# Patient Record
Sex: Female | Born: 1985 | Race: White | Hispanic: No | Marital: Married | Smoking: Never smoker
Health system: Southern US, Community
[De-identification: ages and names within clinical notes are randomized; demographics above are authoritative.]

## PROBLEM LIST (undated history)

## (undated) ENCOUNTER — Inpatient Hospital Stay (HOSPITAL_COMMUNITY): Payer: Self-pay

## (undated) DIAGNOSIS — Z789 Other specified health status: Secondary | ICD-10-CM

## (undated) DIAGNOSIS — Z9189 Other specified personal risk factors, not elsewhere classified: Secondary | ICD-10-CM

## (undated) DIAGNOSIS — Z8619 Personal history of other infectious and parasitic diseases: Secondary | ICD-10-CM

## (undated) HISTORY — PX: WISDOM TOOTH EXTRACTION: SHX21

## (undated) HISTORY — DX: Other specified personal risk factors, not elsewhere classified: Z91.89

## (undated) HISTORY — PX: DILATION AND CURETTAGE OF UTERUS: SHX78

## (undated) HISTORY — DX: Personal history of other infectious and parasitic diseases: Z86.19

---

## 2010-05-05 ENCOUNTER — Other Ambulatory Visit: Payer: Self-pay | Admitting: Obstetrics & Gynecology

## 2010-05-05 ENCOUNTER — Ambulatory Visit (HOSPITAL_COMMUNITY)
Admission: RE | Admit: 2010-05-05 | Discharge: 2010-05-05 | Disposition: A | Discharge status: Home / Self Care | Payer: 59 | Source: Ambulatory Visit | Attending: Obstetrics & Gynecology | Admitting: Obstetrics & Gynecology

## 2010-05-05 DIAGNOSIS — O021 Missed abortion: Secondary | ICD-10-CM | POA: Insufficient documentation

## 2010-05-05 LAB — CBC
Platelets: 308 10*3/uL (ref 150–400)
RBC: 4.59 MIL/uL (ref 3.87–5.11)
RDW: 12.6 % (ref 11.5–15.5)
WBC: 11.2 10*3/uL — ABNORMAL HIGH (ref 4.0–10.5)

## 2010-06-02 NOTE — Op Note (Signed)
NAMERHYS, ANCHONDO              ACCOUNT NO.:  [id number]  MEDICAL RECORD NO.:  [id number]           PATIENT TYPE:  O  LOCATION:  [facility]                          [facility]AC[facility]:  [facility]  PHYSICIAN:  Darryl Nestle, MD     DATE OF BIRTH:  [birth year]  DATE OF PROCEDURE:  05/05/2010 DATE OF DISCHARGE:                              OPERATIVE REPORT   PREOPERATIVE DIAGNOSIS:  A 10 weeks' missed abortion.  POSTOPERATIVE DIAGNOSIS:  A 10 weeks' missed abortion.  PROCEDURE:  Suction, dilatation, and evacuation.  SURGEON:  Darryl Nestle, MD  ASSISTANT:  None.  ANESTHESIA:  General (MAC) and paracervical block.  FINDINGS:  A 10-12 weeks' size anteverted uterus sounded to 11 cm.  No other abnormal findings.  Ultrasound in the office noted absent fetal cardiac activity confirming missed abortion.  SPECIMEN:  Products of conception sent to pathology.  ESTIMATED BLOOD LOSS:  Minimal.  COMPLICATIONS:  None.  CONDITION:  Stable.  DISPOSITION:  To PACU.  PROCEDURE:  The patient is 25 year old G1, P0, who was 11 weeks by 7- week ultrasound who presented for an initial pregnancy visit and was noted to have missed abortion by ultrasound with absent cardiac activity and the fetus measuring 10 weeks'.  The patient's blood type is O+.  The patient was given the option of medical termination versus surgical in the office or in the operating room, and she preferred to proceed with surgical removal of missed abortion in the operating room.  Risks and complications of surgery including infection, bleeding, damage to the uterus, perforation, and delayed complications including infection, Asherman syndrome, etc., reviewed.  The patient voiced understanding. The patient gave informed written consent.  She received Ancef 1 g preop.  She was brought to the operating room with IV running and Ancef going.  She underwent general anesthesia with MAC.  She was given dorsal lithotomy position.  Parts were  prepped and draped in standard fashion. Straight catheterization of bladder done to empty it.  Cervix was exposed with two right angle retractors.  Anterior lip of the cervix was grasped with tenaculum.  10 mL of 1% Nesacaine paracervical block given. Cervical os was dilated to #31 [place] dilator.  A #10 curved suction cannula was assembled with the suction machine.  Uterus was sounded to 11 cm.  Suction cannula was introduced.  Suction machine was turned on and in gentle fashion with rotating the cannula, products of conception were removed.  A gentle sharp curettage was done of the uterine cavity and once again suction cannula was introduced with suctioning of any loose products of conception.  The uterine curettage revealed gritty surface all around and sharp curettage was stopped at this point.  There was no active bleeding from cervical os; and at the end of the procedure, cervical os was also closing.  Tenaculum was removed.  There was no bleeding from the tenaculum site.  Uterus size became about 6 weeks.  The patient received Toradol 30 mg as well as Methergine 0.2 mg intramuscularly in the operating room.  The patient was reversed from general anesthesia and brought to the recovery room in stable  condition. No complications.  Pathology, products of conception.  The patient will be discharged home and followup instructions will be reviewed prior to surgery; and she will follow up with me in office in 2 weeks.  All counts are correct x2.     Darryl Nestle, MD     VM/MEDQ  D:  05/05/2010  T:  05-27-10  Job:  [id number]  Electronically Signed by Susa Day Traevon Meiring  on 06/02/2010 03:21:39 PM

## 2010-06-02 DEATH — deceased

## 2011-05-21 ENCOUNTER — Other Ambulatory Visit: Payer: Self-pay | Admitting: Internal Medicine

## 2011-05-21 DIAGNOSIS — H538 Other visual disturbances: Secondary | ICD-10-CM

## 2011-05-26 ENCOUNTER — Ambulatory Visit
Admission: RE | Admit: 2011-05-26 | Discharge: 2011-05-26 | Disposition: A | Discharge status: Home / Self Care | Payer: 59 | Source: Ambulatory Visit | Attending: Internal Medicine | Admitting: Internal Medicine

## 2011-05-26 DIAGNOSIS — H538 Other visual disturbances: Secondary | ICD-10-CM

## 2012-02-02 NOTE — L&D Delivery Note (Addendum)
Delivery Note  First Stage: Labor onset: ~ 1800 prodromal ctx Augmentation : pitocin 12/10 AM Analgesia /Anesthesia intrapartum: NONE Spontaneous PROM at 0530 am 12/9  Second Stage: Complete dilation at 1244 Onset of pushing at 1300 FHR second stage 150s - graphing maternal pulse intermittently due to maternal positioning and movement  Delivery of a viable female at 66 by CNM in LOA position no nuchal cord Cord double clamped after cessation of pulsation, cut by FOB Cord blood sample collected   Third Stage: Placenta delivered [facility] intact with 3 VC @ 1355 Placenta disposition: home with patient after 24 hours - request signed Uterine tone firm / bleeding small  No perineal / vaginal / labial laceration identified  Anesthesia for repair: 1% local xylocaine Repair 4-0 single figure of 8  for hemostasis - superficial small laceration at clitoral hood Est. Blood Loss (mL): 400  Complications: none  Mom to postpartum.  Baby to Couplet care / Skin to Skin.  Newborn: Birth Weight: 5-10 Apgar Scores: 8-9 Feeding planned: breast  Marlinda Mike CNM, MSN, FACNM 01/10/2013, 2:40 PM

## 2012-07-03 LAB — OB RESULTS CONSOLE RPR: RPR: NONREACTIVE

## 2012-07-03 LAB — OB RESULTS CONSOLE GC/CHLAMYDIA: Chlamydia: NEGATIVE

## 2012-07-03 LAB — OB RESULTS CONSOLE HEPATITIS B SURFACE ANTIGEN: Hepatitis B Surface Ag: NEGATIVE

## 2012-07-03 LAB — OB RESULTS CONSOLE RUBELLA ANTIBODY, IGM: Rubella: IMMUNE

## 2012-07-03 LAB — OB RESULTS CONSOLE ANTIBODY SCREEN: Antibody Screen: NEGATIVE

## 2012-12-24 ENCOUNTER — Inpatient Hospital Stay (HOSPITAL_COMMUNITY)
Admission: AD | Admit: 2012-12-24 | Discharge: 2012-12-24 | Disposition: A | Discharge status: Home / Self Care | Payer: 59 | Source: Ambulatory Visit | Attending: Obstetrics & Gynecology | Admitting: Obstetrics & Gynecology

## 2012-12-24 ENCOUNTER — Encounter (HOSPITAL_COMMUNITY): Payer: Self-pay | Admitting: *Deleted

## 2012-12-24 DIAGNOSIS — O99891 Other specified diseases and conditions complicating pregnancy: Secondary | ICD-10-CM | POA: Insufficient documentation

## 2012-12-24 DIAGNOSIS — E860 Dehydration: Secondary | ICD-10-CM | POA: Insufficient documentation

## 2012-12-24 DIAGNOSIS — O26893 Other specified pregnancy related conditions, third trimester: Secondary | ICD-10-CM

## 2012-12-24 DIAGNOSIS — R51 Headache: Secondary | ICD-10-CM | POA: Insufficient documentation

## 2012-12-24 HISTORY — DX: Other specified health status: Z78.9

## 2012-12-24 LAB — CBC
Hemoglobin: 12.3 g/dL (ref 12.0–15.0)
MCHC: 34.1 g/dL (ref 30.0–36.0)
RBC: 4.08 MIL/uL (ref 3.87–5.11)
WBC: 11.4 10*3/uL — ABNORMAL HIGH (ref 4.0–10.5)

## 2012-12-24 LAB — COMPREHENSIVE METABOLIC PANEL
ALT: 6 U/L (ref 0–35)
Alkaline Phosphatase: 124 U/L — ABNORMAL HIGH (ref 39–117)
CO2: 26 mEq/L (ref 19–32)
Chloride: 103 mEq/L (ref 96–112)
GFR calc Af Amer: >90 mL/min (ref >90)
Glucose, Bld: 76 mg/dL (ref 70–99)
Potassium: 4.5 mEq/L (ref 3.5–5.1)
Sodium: 138 mEq/L (ref 135–145)
Total Bilirubin: 0.3 mg/dL (ref 0.3–1.2)
Total Protein: 6.1 g/dL (ref 6.0–8.3)

## 2012-12-24 LAB — URINALYSIS, ROUTINE W REFLEX MICROSCOPIC
Bilirubin Urine: NEGATIVE
Hgb urine dipstick: NEGATIVE
Specific Gravity, Urine: 1.01 (ref 1.005–1.030)
Urobilinogen, UA: 0.2 mg/dL (ref 0.0–1.0)

## 2012-12-24 MED ORDER — BUTALBITAL-APAP-CAFFEINE 50-325-40 MG PO TABS: 1.0000 | ORAL_TABLET | Freq: Four times a day (QID) | ORAL | Status: AC | PRN

## 2012-12-24 MED ORDER — BUTALBITAL-APAP-CAFFEINE 50-325-40 MG PO TABS
2.0000 | ORAL_TABLET | Freq: Once | ORAL | Status: AC
  Administered 2012-12-24: 2 via ORAL
  Filled 2012-12-24: qty 2

## 2012-12-24 NOTE — MAU Note (Signed)
Patient presents with complaints of headache since 2030 last night; blurred vision and dizziness this morning and nausea since lunchtime.

## 2012-12-24 NOTE — MAU Provider Note (Signed)
  History     CSN: [id number]  Arrival date and time: 12/24/12 1422   First Provider Initiated Contact with Patient 12/24/12 1645      Chief Complaint  Patient presents with  . Headache  . Blurred Vision  . Dizziness  . Nausea   HPI Comments: G5P0040 @ 34.[redacted] wks gestation with headache and blurry vision today despite hydration and Tylenol. No ctx, LOF, or VB. +FM. No hx of migraines.  Headache  This is a new problem. The current episode started today. The quality of the pain is described as aching. The pain is moderate. Associated symptoms include blurred vision. She has tried acetaminophen for the symptoms. The treatment provided no relief.    OB History   Grav Para Term Preterm Abortions TAB SAB Ect Mult Living   5 0   4  4   0      Past Medical History  Diagnosis Date  . Medical history non-contributory     Past Surgical History  Procedure Laterality Date  . Dilation and curettage of uterus    . Wisdom tooth extraction Bilateral     Family History  Problem Relation Age of Onset  . Cancer Maternal Aunt   . Cancer Maternal Grandmother     History  Substance Use Topics  . Smoking status: Never Smoker   . Smokeless tobacco: Never Used  . Alcohol Use: No    Allergies: No Known Allergies  Prescriptions prior to admission  Medication Sig Dispense Refill  . acetaminophen (TYLENOL) 325 MG tablet Take 325 mg by mouth every 6 (six) hours as needed for mild pain.      . Prenatal Vit-Fe Fumarate-FA (PRENATAL MULTIVITAMIN) TABS tablet Take 1 tablet by mouth at bedtime.        Review of Systems  Constitutional: Negative.   Eyes: Positive for blurred vision.  Respiratory: Negative.   Cardiovascular: Negative.   Gastrointestinal: Negative.   Genitourinary: Negative.   Musculoskeletal: Negative.   Skin: Negative.   Neurological: Positive for headaches.  Endo/Heme/Allergies: Negative.   Psychiatric/Behavioral: Negative.    Physical Exam   Blood pressure  112/66, pulse 87, temperature 98 F (36.7 C), temperature source Oral, resp. rate 16, height 5' 4.5" (1.638 m), weight 67.586 kg (149 lb).  Physical Exam  Constitutional: She is oriented to person, place, and time. She appears well-developed and well-nourished.  HENT:  Head: Normocephalic.  Eyes: Pupils are equal, round, and reactive to light.  Neck: Normal range of motion.  Cardiovascular: Normal rate and regular rhythm.   Respiratory: Effort normal and breath sounds normal.  GI: Soft. Bowel sounds are normal.  Genitourinary: Vagina normal and uterus normal.  Gravid, non-tender  Musculoskeletal: Normal range of motion.  Neurological: She is alert and oriented to person, place, and time. She has normal reflexes.  Skin: Skin is warm and dry.  Psychiatric: She has a normal mood and affect.    MAU Course  Procedures CBC-WNL CMP-WNL Uric Acid-WNL UA-WNL NST-reactive  Push po fluids Fioricet MDM n/a  Assessment and Plan  34.[redacted] wks gestation Headache Dehydration Fetal Heart Tracing-Cat I  HA resolved after Fiorocet and increased water intake.  PTL precautions discussed.  Call office for persistent HA or worsening symptoms. Keep appt. scheduled for 01/03/13   Donette Larry, N 12/24/2012, 5:48 PM

## 2013-01-09 ENCOUNTER — Inpatient Hospital Stay (HOSPITAL_COMMUNITY)
Admission: AD | Admit: 2013-01-09 | Discharge: 2013-01-11 | DRG: 775 | Disposition: A | Discharge status: Home / Self Care | Payer: 59 | Source: Ambulatory Visit | Attending: Obstetrics | Admitting: Obstetrics

## 2013-01-09 ENCOUNTER — Encounter (HOSPITAL_COMMUNITY): Payer: Self-pay

## 2013-01-09 DIAGNOSIS — O4200 Premature rupture of membranes, onset of labor within 24 hours of rupture, unspecified weeks of gestation: Secondary | ICD-10-CM | POA: Diagnosis present

## 2013-01-09 DIAGNOSIS — O4290 Premature rupture of membranes, unspecified as to length of time between rupture and onset of labor, unspecified weeks of gestation: Principal | ICD-10-CM | POA: Diagnosis present

## 2013-01-09 DIAGNOSIS — O2620 Pregnancy care for patient with recurrent pregnancy loss, unspecified trimester: Secondary | ICD-10-CM | POA: Diagnosis present

## 2013-01-09 LAB — CBC
HCT: 35.9 % — ABNORMAL LOW (ref 36.0–46.0)
Hemoglobin: 12.3 g/dL (ref 12.0–15.0)
MCH: 30.4 pg (ref 26.0–34.0)
MCHC: 34.3 g/dL (ref 30.0–36.0)
MCV: 88.6 fL (ref 78.0–100.0)
Platelets: 247 10*3/uL (ref 150–400)
RBC: 4.05 MIL/uL (ref 3.87–5.11)
RDW: 13.1 % (ref 11.5–15.5)
WBC: 13.7 10*3/uL — ABNORMAL HIGH (ref 4.0–10.5)

## 2013-01-09 MED ORDER — IBUPROFEN 600 MG PO TABS: 600.0000 mg | ORAL_TABLET | Freq: Four times a day (QID) | ORAL | Status: DC | PRN

## 2013-01-09 MED ORDER — LACTATED RINGERS IV SOLN: 500.0000 mL | INTRAVENOUS | Status: DC | PRN

## 2013-01-09 MED ORDER — NALBUPHINE SYRINGE 5 MG/0.5 ML: 5.0000 mg | INJECTION | INTRAMUSCULAR | Status: DC | PRN

## 2013-01-09 MED ORDER — LIDOCAINE HCL (PF) 1 % IJ SOLN
30.0000 mL | INTRAMUSCULAR | Status: DC | PRN
  Filled 2013-01-09: qty 30

## 2013-01-09 MED ORDER — CITRIC ACID-SODIUM CITRATE 334-500 MG/5ML PO SOLN: 30.0000 mL | ORAL | Status: DC | PRN

## 2013-01-09 MED ORDER — ACETAMINOPHEN 325 MG PO TABS: 650.0000 mg | ORAL_TABLET | ORAL | Status: DC | PRN

## 2013-01-09 MED ORDER — OXYTOCIN 10 UNIT/ML IJ SOLN: 10.0000 [IU] | Freq: Once | INTRAMUSCULAR | Status: DC

## 2013-01-09 NOTE — H&P (Signed)
  OB ADMISSION/ HISTORY & PHYSICAL:  Admission Date: 01/09/2013  7:50 PM  Admit Diagnosis: 36.5 weeks PROM with onset of labor within 24 hours  Angelica Sanders is a 27 y.o. female presenting for admission for onset of labor. PROM at 0530 am today with irregular ctx - evaluated in office late morning with confirmation of ROM - expectant management plan. Onset of regular ctx ~ 1700 today  Prenatal History: G5P0040   EDC : 02/01/2013, Set as working upon episode creation  Prenatal care at [facility]  Primary Ob Provider: Marlinda Mike CNM Prenatal course complicated by hx recurrent pregnancy loss / altered fasting glucose metabolism  Prenatal Labs: ABO, Rh: O/Positive/-- (06/02 0000) Antibody: Negative (06/02 0000) Rubella: Immune (06/02 0000)  RPR: Nonreactive (06/02 0000)  HBsAg: Negative (06/02 0000)  HIV: Non-reactive (06/02 0000)  GTT: deferred (carb modified diet preconception thru pregnancy with consistent FBS less than 90) GBS: Negative (12/04 0000)   Medical / Surgical History :  Past medical history:  Past Medical History  Diagnosis Date  . Medical history non-contributory      Past surgical history:  Past Surgical History  Procedure Laterality Date  . Dilation and curettage of uterus    . Wisdom tooth extraction Bilateral     Family History:  Family History  Problem Relation Age of Onset  . Cancer Maternal Aunt   . Cancer Maternal Grandmother      Social History:  reports that she has never smoked. She has never used smokeless tobacco. She reports that she does not drink alcohol or use illicit drugs.  Allergies: Review of patient's allergies indicates no known allergies.   Current Medications at time of admission:  Prior to Admission medications   Medication Sig Start Date End Date Taking? Authorizing Provider  butalbital-acetaminophen-caffeine (FIORICET) 50-325-40 MG per tablet Take 1-2 tablets by mouth every 6 (six) hours as needed for  headache. 12/24/12 12/24/13 Yes Lawernce Pitts, CNM  Prenatal Vit-Fe Fumarate-FA (PRENATAL MULTIVITAMIN) TABS tablet Take 1 tablet by mouth at bedtime.   Yes Historical Provider, MD    Review of Systems: Active FM onset of ctx @ 1700 currently every 5 minutes LOF  / SROM @ 0530am bloody show absent  Physical Exam:  VS: Blood pressure 120/75, pulse 90, temperature 98 F (36.7 C), temperature source Oral, resp. rate 18, height 5\' 4"  (1.626 m), weight 68.947 kg (152 lb), SpO2 99.00%.  General: alert and oriented, appears calm -some ctx uncomfortable Heart: RRR Lungs: Clear lung fields Abdomen: Gravid, soft and non-tender, non-distended / uterus: gravid Extremities: no edema  Genitalia / VE:  3/80%/vtx/ -1 / forewaters reduced with exam  FHR: baseline rate 135 / variability moderate / accelerations + / no decelerations TOCO: 2-4 minutes mild - moderate  Assessment: 36.[redacted] weeks gestation latent stage of labor FHR category 1   Plan:  Admit Expectant management - patient declines any labor augmentation Discussed if no progression into active labor by 24hr ROM - will need augmentation - agrees   Dr Ernestina Penna notified of admission / plan of care   Marlinda Mike CNM, MSN, [facility] 01/09/2013, 9:12 PM

## 2013-01-09 NOTE — Progress Notes (Signed)
Dc'd monitors, tracing reactive, pt prefers to have intermittent monitoring in labor.

## 2013-01-09 NOTE — MAU Note (Signed)
Pt reports ROM at 0530 this am, some contractions.

## 2013-01-10 ENCOUNTER — Encounter (HOSPITAL_COMMUNITY): Payer: Self-pay | Admitting: Certified Nurse Midwife

## 2013-01-10 LAB — RPR: RPR Ser Ql: NONREACTIVE

## 2013-01-10 MED ORDER — SILVER NITRATE-POT NITRATE 75-25 % EX MISC
1.0000 "application " | Freq: Once | CUTANEOUS | Status: DC
  Filled 2013-01-10: qty 1

## 2013-01-10 MED ORDER — OXYTOCIN 40 UNITS IN LACTATED RINGERS INFUSION - SIMPLE MED
1.0000 m[IU]/min | INTRAVENOUS | Status: DC
  Administered 2013-01-10: 2 m[IU]/min via INTRAVENOUS
  Filled 2013-01-10: qty 1000

## 2013-01-10 MED ORDER — SIMETHICONE 80 MG PO CHEW: 80.0000 mg | CHEWABLE_TABLET | ORAL | Status: DC | PRN

## 2013-01-10 MED ORDER — SENNOSIDES-DOCUSATE SODIUM 8.6-50 MG PO TABS
2.0000 | ORAL_TABLET | ORAL | Status: DC
  Administered 2013-01-10: 2 via ORAL
  Filled 2013-01-10: qty 2

## 2013-01-10 MED ORDER — PRENATAL MULTIVITAMIN CH
1.0000 | ORAL_TABLET | Freq: Every day | ORAL | Status: DC
  Administered 2013-01-10: 1 via ORAL
  Filled 2013-01-10: qty 1

## 2013-01-10 MED ORDER — IBUPROFEN 600 MG PO TABS
600.0000 mg | ORAL_TABLET | Freq: Four times a day (QID) | ORAL | Status: DC
  Administered 2013-01-11: 600 mg via ORAL
  Filled 2013-01-10 (×2): qty 1

## 2013-01-10 MED ORDER — OXYTOCIN 40 UNITS IN LACTATED RINGERS INFUSION - SIMPLE MED: 62.5000 mL/h | INTRAVENOUS | Status: DC

## 2013-01-10 MED ORDER — LANOLIN HYDROUS EX OINT: TOPICAL_OINTMENT | CUTANEOUS | Status: DC | PRN

## 2013-01-10 MED ORDER — OXYCODONE-ACETAMINOPHEN 5-325 MG PO TABS: 1.0000 | ORAL_TABLET | ORAL | Status: DC | PRN

## 2013-01-10 MED ORDER — BENZOCAINE-MENTHOL 20-0.5 % EX AERO
1.0000 "application " | INHALATION_SPRAY | CUTANEOUS | Status: DC | PRN
  Filled 2013-01-10: qty 56

## 2013-01-10 MED ORDER — LACTATED RINGERS IV SOLN: INTRAVENOUS | Status: DC

## 2013-01-10 NOTE — Progress Notes (Signed)
S:  ctx same - not painful just tight       small pink discharge  O:  VS: Blood pressure 118/77, pulse 110, temperature 97.6 F (36.4 C), temperature source Oral, resp. rate 20, height 5\' 4"  (1.626 m), weight 68.947 kg (152 lb), SpO2 99.00%.        FHR : baseline 135 / variability moerate / accelerations + / no decelerations        Toco: contractions every 3-7 minutes / mild         Cervix : 3-4/ 90% / vtx / +1        Membranes: clear fluid with pink tinge  A: PROM with latent labor     FHR category 1  P: discussed with patient - need to stronger and regular ctx to progress into active labor      recommend pitocin augmentation this AM - agrees with plan      desires option for water immersion in labor - understands not candidate for water birth due to gestational age       consent signed for water immersion                 will need continuous EFM on pitocin         Dr Ernestina Penna updated with plan         Marlinda Mike CNM, MSN, [facility] 01/10/2013, 8:09 AM

## 2013-01-10 NOTE — Lactation Note (Signed)
This note was copied from the chart of Angelica Sanders. Lactation Consultation Note  Patient Name: Angelica Sanders Today's Date: 01/10/2013 Reason for consult: Initial assessment of this primipara and her newborn at 49 hours of age.  Mom has private LC ([address] [place], [place]) and states she already assisted her with feeding earlier today and showed her positioning, latch techniques and hand expression.  At present, baby STS with no feeding cues noted and LC discussed normal newborn sleepiness and many benefits of STS and cue feedings. LC encouraged review of Baby and Me pp 14 and 20-25 for STS and BF information. LC provided [employer] brochure and reviewed [facility] services and list of community and web site resources.     Maternal Data Formula Feeding for Exclusion: No Infant to breast within first hour of birth: Yes (LATCH score=10 and baby nursed for 45 minutes) Has patient been taught Hand Expression?: Yes (mom has private LC and states she was shown how to hand express) Does the patient have breastfeeding experience prior to this delivery?: No  Feeding Feeding Type: Breast Fed Length of feed: 10 min  LATCH Score/Interventions Latch: Repeated attempts needed to sustain latch, nipple held in mouth throughout feeding, stimulation needed to elicit sucking reflex.  Audible Swallowing: A few with stimulation  Type of Nipple: Everted at rest and after stimulation  Comfort (Breast/Nipple): Soft / non-tender     Hold (Positioning): No assistance needed to correctly position infant at breast.  LATCH Score: 8  Lactation Tools Discussed/Used   STS, cue feedings [facility] LC resources as needed  Consult Status Consult Status: PRN Follow-up type: In-patient    Warrick Parisian [facility] 01/10/2013, 8:10 PM

## 2013-01-11 LAB — CBC
HCT: 29.3 % — ABNORMAL LOW (ref 36.0–46.0)
Hemoglobin: 10 g/dL — ABNORMAL LOW (ref 12.0–15.0)
MCH: 30 pg (ref 26.0–34.0)
MCHC: 34.1 g/dL (ref 30.0–36.0)
MCV: 88 fL (ref 78.0–100.0)
Platelets: 212 10*3/uL (ref 150–400)
RBC: 3.33 MIL/uL — ABNORMAL LOW (ref 3.87–5.11)
RDW: 13.3 % (ref 11.5–15.5)
WBC: 19.2 10*3/uL — ABNORMAL HIGH (ref 4.0–10.5)

## 2013-01-11 NOTE — Discharge Summary (Signed)
Obstetric Discharge Summary  Reason for Admission: premature rupture of membranes Prenatal Procedures: ultrasound Intrapartum Procedures: augmentation with pitocin /  spontaneous vaginal delivery Postpartum Procedures: none Complications-Operative and Postpartum: none Hemoglobin  Date Value Range Status  01/11/2013 10.0* 12.0 - 15.0 g/dL Final     DELTA CHECK NOTED     REPEATED TO VERIFY     HCT  Date Value Range Status  01/11/2013 29.3* 36.0 - 46.0 % Final    Physical Exam:  General: alert, cooperative and no distress Lochia: appropriate Uterine Fundus: firm Perineum: intact DVT Evaluation: No evidence of DVT seen on physical exam.  Discharge Diagnoses: Term Pregnancy-delivered  Discharge Information: Date: 01/11/2013 Activity: pelvic rest Diet: routine Medications: PNV Condition: stable Instructions: refer to practice specific booklet Discharge to: home  Follow-up Information   Follow up with Angelica Sanders, CNM. Schedule an appointment as soon as possible for a visit in 6 weeks.   Specialty:  Obstetrics and Gynecology   Contact information:   [address] [place] [address] [phone]       Newborn Data: Live born female  Birth Weight: 5 lb 9.8 oz (2546 g) APGAR: 8, 9  Home with mother.  Angelica Sanders 01/11/2013, 8:13 AM

## 2013-01-11 NOTE — Lactation Note (Signed)
This note was copied from the chart of Angelica Sanders. Lactation Consultation Note; Mom reports that Anette Guarneri was in to see her this morning and assisted with latch. No questions at present. To call prn  Patient Name: Angelica Mende [place] [id number] Date: 01/11/2013 Reason for consult: Follow-up assessment   Maternal Data    Feeding   LATCH Score/Interventions                      Lactation Tools Discussed/Used     Consult Status Consult Status: Complete    Pamelia Hoit 01/11/2013, 10:47 AM

## 2013-01-11 NOTE — Progress Notes (Signed)
PPD 1 SVD  S:  Reports feeling Well - wants DC today             Tolerating po/ No nausea or vomiting             Bleeding is moderate to small             Pain controlled with ibuprofen (OTC)             Up ad lib / ambulatory / voiding QS  Newborn breast feeding    O:               VS: BP 110/74  Pulse 97  Temp(Src) 98 F (36.7 C) (Oral)  Resp 18  Ht 5\' 4"  (1.626 m)  Wt 68.947 kg (152 lb)  BMI 26.08 kg/m2  SpO2 98%   LABS:              Recent Labs  01/09/13 2300 01/11/13 0615  WBC 13.7* 19.2*  HGB 12.3 10.0*  PLT 247 212               Blood type: --/--/O POS (12/09 2300)  Rubella: Immune (06/02 0000)                     I&O: Intake/Output     12/10 0701 - 12/11 0700 12/11 0701 - 12/12 0700   Blood 300    Total Output 300     Net -300                        Physical Exam:             Alert and oriented X3  Lungs: Clear and unlabored  Heart: regular rate and rhythm / no mumurs  Abdomen: soft, non-tender, non-distended              Fundus: firm, non-tender, U-3  Perineum: intact  Lochia: light  Extremities: no edema, no calf pain or tenderness    A: PPD # 1   Doing well - stable status  P: Routine post partum orders  DC home             WOB instructions - signs to call / reviewed all instructions  Marlinda Mike CNM, MSN, [facility] 01/11/2013, 8:11 AM

## 2013-12-03 ENCOUNTER — Encounter (HOSPITAL_COMMUNITY): Payer: Self-pay | Admitting: Certified Nurse Midwife

## 2017-03-29 LAB — HM PAP SMEAR

## 2017-06-13 LAB — HM MAMMOGRAPHY

## 2018-01-05 ENCOUNTER — Other Ambulatory Visit: Payer: Self-pay | Admitting: Family Medicine

## 2018-01-05 DIAGNOSIS — R079 Chest pain, unspecified: Secondary | ICD-10-CM

## 2018-01-09 ENCOUNTER — Other Ambulatory Visit: Payer: Self-pay

## 2018-01-11 ENCOUNTER — Other Ambulatory Visit: Payer: Self-pay

## 2018-02-02 ENCOUNTER — Ambulatory Visit: Payer: Self-pay

## 2018-02-02 NOTE — Telephone Encounter (Signed)
Pt called for a new patient appointment stating that she has had chest pain and pressure since November.  The pain radiates to the shoulder rt side. It getsts worse with activity and eating.  Now pain is mild at 1-3. She state her previous Dr did CXR and blood work and told her to go to ED for worsening symptoms. Pt is taking Birth Control medication Per protocol pt will go to ED for evaluation of symptoms. Care advice read to patient. PT verbalized understanding of all instructions.  Reason for Disposition . Pain also present in shoulder(s) or arm(s) or jaw  (Exception: pain is clearly made worse by movement)  Answer Assessment - Initial Assessment Questions 1. LOCATION: "Where does it hurt?"       Rt side behind breast  2. RADIATION: "Does the pain go anywhere else?" (e.g., into neck, jaw, arms, back)     To shoulder rt 3. ONSET: "When did the chest pain begin?" (Minutes, hours or days)      November noticed it some in the summer 4. PATTERN "Does the pain come and go, or has it been constant since it started?"  "Does it get worse with exertion?"      Intensity gets worse with activity and eating 5. DURATION: "How long does it last" (e.g., seconds, minutes, hours)     Hours  6. SEVERITY: "How bad is the pain?"  (e.g., Scale 1-10; mild, moderate, or severe)    - MILD (1-3): doesn't interfere with normal activities     - MODERATE (4-7): interferes with normal activities or awakens from sleep    - SEVERE (8-10): excruciating pain, unable to do any normal activities       1-3 now   7. CARDIAC RISK FACTORS: "Do you have any history of heart problems or risk factors for heart disease?" (e.g., prior heart attack, angina; high blood pressure, diabetes, being overweight, high cholesterol, smoking, or strong family history of heart disease)     no 8. PULMONARY RISK FACTORS: "Do you have any history of lung disease?"  (e.g., blood clots in lung, asthma, emphysema, birth control pills)     Birth control  pills 9. CAUSE: "What do you think is causing the chest pain?"     no 10. OTHER SYMPTOMS: "Do you have any other symptoms?" (e.g., dizziness, nausea, vomiting, sweating, fever, difficulty breathing, cough)       SOB 2 weeks ago severe and sick vomiting nausea and diarrhea 11. PREGNANCY: "Is there any chance you are pregnant?" "When was your last menstrual period?"       No just about 4 weeks ago  Protocols used: CHEST PAIN-A-AH

## 2018-02-02 NOTE — Telephone Encounter (Signed)
See note

## 2018-02-03 ENCOUNTER — Encounter (HOSPITAL_COMMUNITY): Payer: Self-pay

## 2018-02-03 ENCOUNTER — Ambulatory Visit (HOSPITAL_COMMUNITY): Payer: 59

## 2018-02-03 ENCOUNTER — Emergency Department (HOSPITAL_COMMUNITY)
Admission: EM | Admit: 2018-02-03 | Discharge: 2018-02-03 | Disposition: A | Discharge status: Home / Self Care | Payer: 59 | Attending: Emergency Medicine | Admitting: Emergency Medicine

## 2018-02-03 ENCOUNTER — Other Ambulatory Visit: Payer: Self-pay

## 2018-02-03 ENCOUNTER — Emergency Department (HOSPITAL_COMMUNITY): Payer: 59

## 2018-02-03 DIAGNOSIS — R079 Chest pain, unspecified: Secondary | ICD-10-CM | POA: Diagnosis not present

## 2018-02-03 DIAGNOSIS — R1011 Right upper quadrant pain: Secondary | ICD-10-CM | POA: Diagnosis not present

## 2018-02-03 LAB — URINALYSIS, ROUTINE W REFLEX MICROSCOPIC
Bacteria, UA: NONE SEEN
Bilirubin Urine: NEGATIVE
Glucose, UA: NEGATIVE mg/dL
Ketones, ur: NEGATIVE mg/dL
Leukocytes, UA: NEGATIVE
NITRITE: NEGATIVE
PROTEIN: NEGATIVE mg/dL
Specific Gravity, Urine: 1.005 (ref 1.005–1.030)
pH: 8 (ref 5.0–8.0)

## 2018-02-03 LAB — HEPATIC FUNCTION PANEL
ALT: 14 U/L (ref 0–44)
AST: 19 U/L (ref 15–41)
Albumin: 3.9 g/dL (ref 3.5–5.0)
Alkaline Phosphatase: 43 U/L (ref 38–126)
BILIRUBIN DIRECT: 0.1 mg/dL (ref 0.0–0.2)
BILIRUBIN INDIRECT: 0.5 mg/dL (ref 0.3–0.9)
BILIRUBIN TOTAL: 0.6 mg/dL (ref 0.3–1.2)
Total Protein: 7.2 g/dL (ref 6.5–8.1)

## 2018-02-03 LAB — PREGNANCY, URINE: Preg Test, Ur: NEGATIVE

## 2018-02-03 LAB — BASIC METABOLIC PANEL
ANION GAP: 9 (ref 5–15)
BUN: 10 mg/dL (ref 6–20)
CALCIUM: 9.7 mg/dL (ref 8.9–10.3)
CO2: 26 mmol/L (ref 22–32)
Chloride: 103 mmol/L (ref 98–111)
Creatinine, Ser: 0.67 mg/dL (ref 0.44–1.00)
GFR calc Af Amer: >60 mL/min (ref >60)
GFR calc non Af Amer: >60 mL/min (ref >60)
GLUCOSE: 89 mg/dL (ref 70–99)
Potassium: 4 mmol/L (ref 3.5–5.1)
Sodium: 138 mmol/L (ref 135–145)

## 2018-02-03 LAB — CBC
HCT: 43.3 % (ref 36.0–46.0)
Hemoglobin: 13.6 g/dL (ref 12.0–15.0)
MCH: 29 pg (ref 26.0–34.0)
MCHC: 31.4 g/dL (ref 30.0–36.0)
MCV: 92.3 fL (ref 80.0–100.0)
NRBC: 0 % (ref 0.0–0.2)
PLATELETS: 376 10*3/uL (ref 150–400)
RBC: 4.69 MIL/uL (ref 3.87–5.11)
RDW: 12.3 % (ref 11.5–15.5)
WBC: 5 10*3/uL (ref 4.0–10.5)

## 2018-02-03 LAB — I-STAT TROPONIN, ED: Troponin i, poc: 0 ng/mL (ref 0.00–0.08)

## 2018-02-03 LAB — D-DIMER, QUANTITATIVE: D-Dimer, Quant: <0.27 ug/mL-FEU (ref 0.00–0.50)

## 2018-02-03 LAB — I-STAT BETA HCG BLOOD, ED (MC, WL, AP ONLY)

## 2018-02-03 NOTE — ED Provider Notes (Signed)
[facility]   CSN: [id number] Arrival date & time: 02/03/18  [id number]     History   Chief Complaint Chief Complaint  Patient presents with  . Chest Pain    HPI Angelica Sanders is a 33 y.o. female.  She is presenting with some right-sided chest pressure shortness of breath that is been going on since the summer.  She said it has been more progressive since November and been steady the entire time.  Its worse with exertion that is worse with eating.  There are sometimes some nausea and vomiting and some diarrhea and constipation.  She does not think there is been a fever.  She cannot recall any trauma to account for.  Pain is worse with deep breaths at times.  The history is provided by the patient.  Chest Pain   This is a new problem. Episode onset: months. The problem occurs constantly. The problem has not changed since onset.The pain is associated with exertion, breathing and eating. The pain is present in the lateral region. The pain is moderate. The quality of the pain is described as burning and pressure-like. The pain does not radiate. Associated symptoms include exertional chest pressure, nausea and shortness of breath. Pertinent negatives include no abdominal pain, no cough, no diaphoresis, no fever, no lower extremity edema, no numbness and no syncope. She has tried nothing for the symptoms. The treatment provided no relief. There are no known risk factors.    Past Medical History:  Diagnosis Date  . Medical history non-contributory     Patient Active Problem List   Diagnosis Date Noted  . Postpartum care following vaginal delivery (12/10) 01/10/2013  . PROM with onset of labor within 24 hours of rupture 01/09/2013    Past Surgical History:  Procedure Laterality Date  . DILATION AND CURETTAGE OF UTERUS    . WISDOM TOOTH EXTRACTION Bilateral      OB History    Gravida  5   Para  1   Term      Preterm  1   AB  4    Living  1     SAB  4   TAB      Ectopic      Multiple      Live Births  1            Home Medications    Prior to Admission medications   Medication Sig Start Date End Date Taking? Authorizing Provider  Prenatal Vit-Fe Fumarate-FA (PRENATAL MULTIVITAMIN) TABS tablet Take 1 tablet by mouth at bedtime.    [provider]    Family History Family History  Problem Relation Age of Onset  . Cancer Maternal Aunt   . Cancer Maternal Grandmother     Social History Social History   Tobacco Use  . Smoking status: Never Smoker  . Smokeless tobacco: Never Used  Substance Use Topics  . Alcohol use: No  . Drug use: No     Allergies   Patient has no known allergies.   Review of Systems Review of Systems  Constitutional: Negative for diaphoresis and fever.  HENT: Negative for sore throat.   Eyes: Negative for visual disturbance.  Respiratory: Positive for shortness of breath. Negative for cough.   Cardiovascular: Positive for chest pain. Negative for syncope.  Gastrointestinal: Positive for nausea. Negative for abdominal pain.  Genitourinary: Negative for dysuria.  Musculoskeletal: Negative for joint swelling.  Skin: Negative for rash.  Neurological: Negative for numbness.     Physical Exam Updated Vital Signs BP 108/71 (BP Location: Right Arm)   Pulse 73   Temp 98.5 F (36.9 C) (Oral)   Resp 17   SpO2 100%   Physical Exam Vitals signs and nursing note reviewed.  Constitutional:      General: She is not in acute distress.    Appearance: She is well-developed.  HENT:     Head: Normocephalic and atraumatic.  Eyes:     Conjunctiva/sclera: Conjunctivae normal.  Neck:     Musculoskeletal: Neck supple.  Cardiovascular:     Rate and Rhythm: Normal rate and regular rhythm.  No extrasystoles are present.    Heart sounds: Normal heart sounds. No murmur.  Pulmonary:     Effort: Pulmonary effort is normal. No respiratory distress.     Breath  sounds: Normal breath sounds.  Abdominal:     Palpations: Abdomen is soft.     Tenderness: There is no abdominal tenderness.  Musculoskeletal: Normal range of motion.     Right lower leg: She exhibits no tenderness. No edema.     Left lower leg: She exhibits no tenderness. No edema.  Skin:    General: Skin is warm and dry.     Capillary Refill: Capillary refill takes less than 2 seconds.  Neurological:     General: No focal deficit present.     Mental Status: She is alert.      ED Treatments / Results  Labs (all labs ordered are listed, but only abnormal results are displayed) Labs Reviewed  URINALYSIS, ROUTINE W REFLEX MICROSCOPIC - Abnormal; Notable for the following components:      Result Value   Color, Urine COLORLESS (*)    Hgb urine dipstick SMALL (*)    All other components within normal limits  BASIC METABOLIC PANEL  CBC  PREGNANCY, URINE  HEPATIC FUNCTION PANEL  D-DIMER, QUANTITATIVE (NOT AT [facility])  I-STAT TROPONIN, ED  I-STAT BETA HCG BLOOD, ED ([facility], [facility], [facility] ONLY)    EKG EKG Interpretation  Date/Time:  Friday February 03 2018 09:46:19 EST Ventricular Rate:  80 PR Interval:  124 QRS Duration: 84 QT Interval:  368 QTC Calculation: 424 R Axis:   52 Text Interpretation:  Normal sinus rhythm Normal ECG no prior to compare with Confirmed by Meridee Score [phone]) on 02/03/2018 12:12:13 PM   Radiology Dg Chest 2 View  Result Date: 02/03/2018 CLINICAL DATA:  Chest pain. EXAM: CHEST - 2 VIEW COMPARISON:  No recent prior. FINDINGS: Mediastinum hilar structures normal. Lungs are clear. No pleural effusion or pneumothorax. Heart size normal. Thoracolumbar spine mild degenerative change. IMPRESSION: No acute cardiopulmonary disease. Electronically Signed   By: Maisie Fus  Register   On: 02/03/2018 10:49   US Abdomen Limited Ruq  Result Date: 02/03/2018 CLINICAL DATA:  Pain x6 months EXAM: ULTRASOUND ABDOMEN LIMITED RIGHT UPPER QUADRANT COMPARISON:  None. FINDINGS:  Gallbladder: No gallstones or wall thickening visualized. 4 mm polyp, of doubtful clinical significance. No sonographic Murphy sign noted by sonographer. Common bile duct: Diameter: 4.9 mm, unremarkable Liver: No focal lesion identified. Within normal limits in parenchymal echogenicity. Portal vein is patent on color Doppler imaging with normal direction of blood flow towards the liver. IMPRESSION: 1. No acute findings. 2. 4 mm gallbladder wall polyp incidentally noted Electronically Signed   By: Corlis Leak M.D.   On: 02/03/2018 14:21    Procedures Procedures (including critical care time)  Medications Ordered in ED Medications - No  data to display   Initial Impression / Assessment and Plan / ED Course  I have reviewed the triage vital signs and the nursing notes.  Pertinent labs & imaging results that were available during my care of the patient were reviewed by me and considered in my medical decision making (see chart for details).  Clinical Course as of Feb 05 1252  Caleen EssexFri Feb 03, 2018  1347 Updated the patient that so far all of her testing has been completely normal negative d-dimer with no risk factors makes it extremely unlikely that she has a PE.  Troponin EKG chest x-ray normal.  LFTs normal.  No elevated white count.  I offered to put her in for right upper quadrant ultrasound or something that she could follow-up with her doctor for her and she said she did like to get it done now.   [MB]    Clinical Course User Index [MB] Terrilee FilesButler, Sankalp Ferrell C, MD     Final Clinical Impressions(s) / ED Diagnoses   Final diagnoses:  RUQ abdominal pain  Nonspecific chest pain    ED Discharge Orders    None       Terrilee FilesButler, Ralonda Tartt C, MD 02/04/18 1254

## 2018-02-03 NOTE — ED Triage Notes (Signed)
Pt here with c/o right sided  Chest pain off and on since November , pt has some n/v/ and sob

## 2018-02-03 NOTE — Telephone Encounter (Signed)
See message.

## 2018-02-03 NOTE — Discharge Instructions (Signed)
You were evaluated in the emergency department for right-sided chest pain and shortness of breath that is been going on for a while.  You had blood work EKG chest x-ray and a right upper quadrant ultrasound that did not show an obvious explanation for your symptoms.  Will be important for you to follow-up with your doctor for continued work-up of this.  Please return if any worsening symptoms.

## 2018-02-03 NOTE — ED Notes (Signed)
Patient transported to US 

## 2018-02-06 ENCOUNTER — Encounter: Payer: Self-pay | Admitting: Family Medicine

## 2018-02-06 ENCOUNTER — Ambulatory Visit (INDEPENDENT_AMBULATORY_CARE_PROVIDER_SITE_OTHER): Payer: 59 | Admitting: Family Medicine

## 2018-02-06 DIAGNOSIS — R0789 Other chest pain: Secondary | ICD-10-CM | POA: Diagnosis not present

## 2018-02-06 NOTE — Progress Notes (Deleted)
Patient: Angelica Sanders MRN: [id number] DOB: 1985-04-03 PCP: Orland Mustard, MD     Subjective:  No chief complaint on file.   HPI: The patient is a 33 y.o. female who presents today for annual exam. {He/she (caps):30048} denies any changes to past medical history. There have been no recent hospitalizations. They {Actions; are/are not:16769} following a well balanced diet and exercise plan. Weight has been {trend:16658}. No complaints today.    There is no immunization history on file for this patient. Colonoscopy: Mammogram:  Pap smear:  PSA:   Review of Systems  Allergies Patient has No Known Allergies.  Past Medical History Patient  has a past medical history of Medical history non-contributory.  Surgical History Patient  has a past surgical history that includes Dilation and curettage of uterus and Wisdom tooth extraction (Bilateral).  Family History Pateint's family history includes Cancer in her maternal aunt and maternal grandmother.  Social History Patient  reports that she has never smoked. She has never used smokeless tobacco. She reports that she does not drink alcohol or use drugs.    Objective: There were no vitals filed for this visit.  There is no height or weight on file to calculate BMI.  Physical Exam     Assessment/plan:   No problem-specific Assessment & Plan notes found for this encounter.    No follow-ups on file.     Orland Mustard, MD [facility]  02/06/2018

## 2018-02-06 NOTE — Progress Notes (Signed)
Patient: Angelica Sanders MRN: [id number] DOB: [birth year] PCP: Orland Mustard, MD     Subjective:  Chief Complaint  Patient presents with  . Establish Care  . Chest Pain    worsening over course of 6 mos    HPI: The patient is a 33 y.o. female who presents today for hx of chest pain x 6 months and to establish care. Was seen in ER for chest pain on 02/03/2018. EKG/labs and work up negative. CXR and abdominal ultrasound done and wnl. Pain is middle of her right nipple and will radiate to her right scapula. Pain is constant. Pain rated as a 3/10. If she breaths in deeply it's a sharp pain. If she has been physical it's more throbbing in nature. She does yoga and it doesn't make it better or worse. Sometimes feels like "milk is letting down: but she is not breast feeding. Nothing makes it better. She has not taken anything for pain, no heating pad. She can not recall any precipitating events. She does get shortness of breath with exertion that is out of the ordinary for her. She has no swelling in her legs and on coughing. No movement makes it worse/better. No skin changes. She states it has gotten worse over the past 6 months.   Review of Systems  Constitutional: Negative for fatigue.  Respiratory: Positive for shortness of breath.   Cardiovascular: Negative for chest pain.  Gastrointestinal: Negative for abdominal pain and nausea.  Musculoskeletal: Positive for arthralgias. Negative for back pain and neck pain.       Pt states pain occasionally radiates to right shoulder, rib area.  Chest pain is constant and recent EKG was obtained on 1/3-WNL  Skin: Negative.   Neurological: Negative for dizziness and headaches.    Allergies Patient is allergic to benadryl [diphenhydramine].  Past Medical History Patient  has a past medical history of History of chicken pox, History of fainting spells of unknown cause, and Medical history non-contributory.  Surgical History Patient  has a past  surgical history that includes Dilation and curettage of uterus and Wisdom tooth extraction (Bilateral).  Family History Pateint's family history includes Asthma in her maternal grandmother; Cancer in her maternal aunt, maternal grandmother, and paternal grandfather; Heart attack in her maternal grandfather; Hyperlipidemia in her father; Hypertension in her father.  Social History Patient  reports that she has never smoked. She has never used smokeless tobacco. She reports current alcohol use. She reports that she does not use drugs.    Objective: Vitals:   02/06/18 1017  BP: 112/80  Pulse: 82  Temp: 97.6 F (36.4 C)  TempSrc: Oral  SpO2: 99%  Weight: 125 lb (56.7 kg)  Height: 5\' 5"  (1.651 m)    Body mass index is 20.8 kg/m.  Physical Exam Vitals signs reviewed.  Constitutional:      Appearance: She is well-developed and normal weight.  HENT:     Right Ear: External ear normal.     Left Ear: External ear normal.  Eyes:     Conjunctiva/sclera: Conjunctivae normal.     Pupils: Pupils are equal, round, and reactive to light.  Neck:     Musculoskeletal: Normal range of motion and neck supple.     Thyroid: No thyromegaly.  Cardiovascular:     Rate and Rhythm: Normal rate and regular rhythm.     Heart sounds: Normal heart sounds. No murmur.  Pulmonary:     Effort: Pulmonary effort is normal.     Breath  sounds: Normal breath sounds.  Chest:     Chest wall: No deformity, tenderness or crepitus.  Abdominal:     General: Bowel sounds are normal. There is no distension.     Palpations: Abdomen is soft.     Tenderness: There is no abdominal tenderness.  Musculoskeletal: Normal range of motion.     Comments: Normal right shoulder exam. No TTp over right pectoralis/breast or right scapula/throacic area   Lymphadenopathy:     Cervical: No cervical adenopathy.  Skin:    General: Skin is warm and dry.     Findings: No rash.  Neurological:     Mental Status: She is alert and  oriented to person, place, and time.     Cranial Nerves: No cranial nerve deficit.     Coordination: Coordination normal.     Deep Tendon Reflexes: Reflexes normal.  Psychiatric:        Behavior: Behavior normal.        Depression screen PHQ 2/9 02/06/2018  Decreased Interest 0  Down, Depressed, Hopeless 0  PHQ - 2 Score 0    Assessment/plan: 1. Other chest pain Musculoskeletal vs. Nerve issue. Will check CT of her chest first to rule out PE, although extremely low likelihood, mass or other structural/pathological issue causing pain. Does not sound like her heart and gallbladder was ruled out. If this is negative, will need to figure out if sending to sports med or ortho would be next step to see if MRI imaging is warranted. She declines muscle relaxer and recommended NSAIDS which she also does not want to use. 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 Already taking tumeric. Recommended heating pad and continue with yoga. Reviewed all labs from ER. Do not think we need to check more labs at this time.  - CT Chest W Contrast; Future     Return if symptoms worsen or fail to improve.    Orland Mustard, MD [place]   02/06/2018

## 2018-02-09 ENCOUNTER — Encounter: Payer: Self-pay | Admitting: Family Medicine

## 2018-02-09 ENCOUNTER — Telehealth: Payer: Self-pay

## 2018-02-09 NOTE — Telephone Encounter (Signed)
Spoke to Mliss Sax, Charity fundraiser at [employer]. CT of chest w/contrast has been approved for 1 date of service.  Approval good from 02/09/18-08/08/18.  To be performed at [facility].  CPT code 82423  Approval/Auth # [id number]

## 2018-02-13 ENCOUNTER — Encounter: Payer: Self-pay | Admitting: Radiology

## 2018-02-13 ENCOUNTER — Ambulatory Visit
Admission: RE | Admit: 2018-02-13 | Discharge: 2018-02-13 | Disposition: A | Discharge status: Home / Self Care | Payer: 59 | Source: Ambulatory Visit | Attending: Family Medicine | Admitting: Family Medicine

## 2018-02-13 DIAGNOSIS — R0789 Other chest pain: Secondary | ICD-10-CM

## 2018-02-13 MED ORDER — IOPAMIDOL (ISOVUE-300) INJECTION 61%
75.0000 mL | Freq: Once | INTRAVENOUS | Status: AC | PRN
  Administered 2018-02-13: 75 mL via INTRAVENOUS

## 2018-02-14 ENCOUNTER — Encounter: Payer: Self-pay | Admitting: Family Medicine

## 2018-02-17 ENCOUNTER — Other Ambulatory Visit: Payer: Self-pay | Admitting: Family Medicine

## 2018-02-17 DIAGNOSIS — N644 Mastodynia: Secondary | ICD-10-CM

## 2018-02-20 ENCOUNTER — Encounter: Payer: Self-pay | Admitting: Sports Medicine

## 2018-02-20 ENCOUNTER — Ambulatory Visit (INDEPENDENT_AMBULATORY_CARE_PROVIDER_SITE_OTHER): Payer: 59 | Admitting: Sports Medicine

## 2018-02-20 VITALS — BP 102/76 | HR 64 | Ht 65.0 in | Wt 123.2 lb

## 2018-02-20 DIAGNOSIS — M9901 Segmental and somatic dysfunction of cervical region: Secondary | ICD-10-CM

## 2018-02-20 DIAGNOSIS — R079 Chest pain, unspecified: Secondary | ICD-10-CM | POA: Insufficient documentation

## 2018-02-20 DIAGNOSIS — M9902 Segmental and somatic dysfunction of thoracic region: Secondary | ICD-10-CM | POA: Diagnosis not present

## 2018-02-20 DIAGNOSIS — M9908 Segmental and somatic dysfunction of rib cage: Secondary | ICD-10-CM

## 2018-02-20 NOTE — Patient Instructions (Addendum)
Please perform the exercise program that we have prepared for you and gone over in detail on a daily basis.  In addition to the handout you were provided you can access your program through: [email or web address]   Your unique program code is:  [email or web address]

## 2018-02-20 NOTE — Progress Notes (Signed)
Angelica Sanders. Angelica [facility] at [facility] [phone]  Angelica Sanders - 33 y.o. female MRN [id number]  Date of birth: [birth year]  Visit Date: February 20, 2018  PCP: Orland Mustard, MD   Referred by: Orland Mustard, MD  SUBJECTIVE:  Chief Complaint  Patient presents with  . New Patient (Initial Visit)    R chest /breast pain    HPI: Patient presents with persistent worsening right sided breast and chest pain that is been present since June or July of this past year.  She reports no known injury but has increased her yoga activities during that time and may relate this to that but does not report any significant trauma she can think of.  She has been seen by multiple providers and has undergone extensive work-up reviewed in the electronic medical record.  Negative for cardiac.  She has had a CT of the chest which is also reassuring.  Ultrasound of the abdomen is normal.  She reports the pain is mainly dull most the time but occasionally becomes sharp and stabbing especially with coughing and sneezing.  It is worse with deep breathing and exertion.  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  REVIEW OF SYSTEMS: Denies fevers, chills, recent weight gain or weight loss.  No night sweats. No significant nighttime awakenings due to this issue. Pt denies any change in bowel or bladder habits, muscle weakness, numbness or falls associated with this pain. Otherwise 12 point review of systems performed and is negative  HISTORY:  Prior history reviewed and updated per electronic medical record.  Social History   Occupational History  . Not on file  Tobacco Use  . Smoking status: Never Smoker  . Smokeless tobacco: Never Used  Substance and Sexual Activity  . Alcohol use: Yes  . Drug use: No  . Sexual activity: Yes    Birth control/protection: None   Social History    Social History Narrative  . Not on file   Past Medical History:  Diagnosis Date  . History of chicken pox   . History of fainting spells of unknown cause   . Medical history non-contributory    Past Surgical History:  Procedure Laterality Date  . DILATION AND CURETTAGE OF UTERUS    . WISDOM TOOTH EXTRACTION Bilateral    family history includes Asthma in her maternal grandmother; Cancer in her maternal aunt, maternal grandmother, and paternal grandfather; Heart attack in her maternal grandfather; Hyperlipidemia in her father; Hypertension in her father.  DATA OBTAINED & REVIEWED:  CT reassuring  OBJECTIVE:  VS:  HT:5\' 5"  (165.1 cm)   WT:123 lb 3.2 oz (55.9 kg)  BMI:20.5    BP:102/76  HR:64bpm  TEMP: ( )  RESP:99 %   PHYSICAL EXAM: CONSTITUTIONAL: Well-developed, Well-nourished and In no acute distress EYES: Pupils are equal., EOM intact without nystagmus. and No scleral icterus. Psychiatric: Alert & appropriately interactive. and Not depressed or anxious appearing. EXTREMITY EXAM: Warm and well perfused  She has a slight scoliosis to the right but this is minimal.  She has a prominence of the right posterior thoracic cage that is mild.  She has mild tenderness over this region.  She has tight paraspinal and anterior cervical musculature.  She has markedly anterior tight chain. Functionally she has a posterior rib.  Trigger points within the levator scapula and rhomboids.  Neck has good range  of motion with negative Spurling's compression test and Lhermitte's compression test.    ASSESSMENT   1. Right-sided chest pain   2. Somatic dysfunction of cervical region   3. Somatic dysfunction of thoracic region   4. Somatic dysfunction of rib cage region      PROCEDURES:  PROCEDURE NOTE: THERAPEUTIC EXERCISES ([id number])   Discussed the foundation of treatment for this condition is physical therapy and/or daily (5-6 days/week) therapeutic exercises, focusing on core  strengthening, coordination, neuromuscular control/reeducation. 15 minutes spent for Therapeutic exercises as below and as referenced in the AVS. This included exercises focusing on stretching, strengthening, with significant focus on eccentric aspects.  Proper technique shown and discussed handout in great detail with ATC. All questions were discussed and answered.   Long term goals include an improvement in range of motion, strength, endurance as well as avoiding reinjury. Frequency of visits is one time as determined during today's office visit. Frequency of exercises to be performed is as per handout.  EXERCISES REVIEWED:   Thoracic Mobility pec stretching   PROCEDURE NOTE: OSTEOPATHIC MANIPULATION   The decision today to treat with Osteopathic Manipulative Therapy (OMT) was based on physical exam findings. Verbal consent was obtained following a discussion with the patient regarding the of risks, benefits and potential side effects, including an acute pain flare,post manipulation soreness and need for repeat treatments. 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 Patient was given medications, exercises, stretches and lifestyle modifications per AVS and verbally.       PLAN:  Pertinent additional documentation may be included in corresponding procedure notes, imaging studies, problem based documentation and patient instructions.  This does seem to be functional more so than anything and likely had a component of costochondritis.  She responded well to osteopathic  manipulation today and we will plan to follow-up with her in 2 weeks to consider repeat manipulation.  Links to [employer] videos provided today per Patient Instructions.  These exercises were developed by Myles LippsEric Goodman, DC with a strong emphasis on core neuromuscular reducation and postural realignment through body-weight exercises.   Activity modifications and the importance of avoiding exacerbating activities (limiting pain to no more than a 4 / 10 during or following activity) recommended and discussed. Discussed red flag symptoms that warrant earlier emergent evaluation and patient voices understanding.  Return in about 2 weeks (around 03/06/2018).          Andrena MewsMichael D Yitzel Shasteen, DO    [facility]

## 2018-03-02 LAB — TESTOSTERONE TOTAL,FREE,BIO, MALES: .: 37

## 2018-03-02 LAB — TESTOSTERONE, FREE: .: 3.6

## 2018-03-02 LAB — PROGESTERONE: .: 1.6

## 2018-03-02 LAB — CHG US, BREAST(S), REAL TIME

## 2018-03-07 ENCOUNTER — Ambulatory Visit: Payer: 59 | Admitting: Sports Medicine

## 2018-03-13 ENCOUNTER — Encounter: Payer: Self-pay | Admitting: Sports Medicine

## 2018-03-13 ENCOUNTER — Ambulatory Visit (INDEPENDENT_AMBULATORY_CARE_PROVIDER_SITE_OTHER): Payer: 59 | Admitting: Sports Medicine

## 2018-03-13 VITALS — BP 100/68 | HR 88 | Ht 65.0 in | Wt 123.0 lb

## 2018-03-13 DIAGNOSIS — R079 Chest pain, unspecified: Secondary | ICD-10-CM | POA: Diagnosis not present

## 2018-03-13 DIAGNOSIS — M9908 Segmental and somatic dysfunction of rib cage: Secondary | ICD-10-CM | POA: Diagnosis not present

## 2018-03-13 DIAGNOSIS — M9901 Segmental and somatic dysfunction of cervical region: Secondary | ICD-10-CM

## 2018-03-13 DIAGNOSIS — M9902 Segmental and somatic dysfunction of thoracic region: Secondary | ICD-10-CM

## 2018-03-13 NOTE — Progress Notes (Signed)
Veverly FellsMichael D. Delorise Shinerigby, DO  [facility] [place] [phone]  Satira AnisHeather M Cargile - 33 y.o. female MRN [id number]  Date of birth: May 04, 1985  Visit Date: March 13, 2018  PCP: Orland MustardWolfe, Allison, MD   Referred by: Orland MustardWolfe, Allison, MD  SUBJECTIVE:  Chief Complaint  Patient presents with  . Follow-up    R chest pain.  Turmeric.  HEP consisting of t-spine mobility and rotation, pec stretch.    HPI: Patient is here for follow-up of her right-sided chest wall pain.  She had complete resolution of the symptoms with her last visit with the osteopathic manipulation.  She was slightly sore immediately following this but it was minimal.  The only problem she reports at this time is that her left clavicle region is popping with thoracic rotation exercises it is disconcerting but not necessarily painful.  Her bilateral upper extremities are overall doing well.  She has no nighttime disturbances.  Not having to take any medications for this.  REVIEW OF SYSTEMS: Per HPI  HISTORY:  Prior history reviewed and updated per electronic medical record.  Patient Active Problem List   Diagnosis Date Noted  . Right-sided chest pain 02/20/2018    CT chest 02/13/2018. Mmg/ultrasound right breast normal 06/2017 IMPRESSION: Unremarkable chest CT.    Social History   Occupational History  . Not on file  Tobacco Use  . Smoking status: Never Smoker  . Smokeless tobacco: Never Used  Substance and Sexual Activity  . Alcohol use: Yes  . Drug use: No  . Sexual activity: Yes    Birth control/protection: None   Social History   Social History Narrative  . Not on file     OBJECTIVE:  VS:  HT:5\' 5"  (165.1 cm)   WT:123 lb (55.8 kg)  BMI:20.47    BP:100/68  HR:88bpm  TEMP: ( )  RESP:99 %   PHYSICAL EXAM: Adult female. No acute distress.  Alert and appropriate. Bilateral upper extremities overall well aligned.  She has full overhead range of motion.  Slightly  limited thoracic rotation on the left compared to the right.  She does have a palpable shift of her clavicle over her first rib with left-sided thoracic rotation.  There are generalized paraspinal muscle spasms.  Negative Spurling's compression test and Lhermitte's compression test.  Left-sided cervicothoracic junction is slightly painful.   ASSESSMENT:   1. Right-sided chest pain   2. Somatic dysfunction of cervical region   3. Somatic dysfunction of thoracic region   4. Somatic dysfunction of rib cage region      PROCEDURES:  PROCEDURE NOTE: OSTEOPATHIC MANIPULATION   The decision today to treat with Osteopathic Manipulative Therapy (OMT) was based on physical exam findings. Verbal consent was obtained following a discussion with the patient regarding the of risks, benefits and potential side effects, including an acute pain flare,post manipulation soreness and need for repeat treatments.  NONE  Manipulation was performed as below:  Regions Treated & Osteopathic Exam Findings   CERVICAL SPINE:   OA - rotated right C6 Extended, rotated LEFT, sidebent LEFT THORACIC SPINE:   T2 FRS LEFT (Flexed, Rotated & Sidebent) RIBS:   Rib 1Left  Exhalation dysfunction (elevated/inhaled)    OMT Techniques Used:   HVLA muscle energy myofascial release soft tissue    The patient tolerated the treatment well and reported Improved symptoms following treatment today. Patient was given medications, exercises, stretches and lifestyle modifications per AVS and verbally.  PLAN:  Pertinent additional documentation may be included in corresponding procedure notes, imaging studies, problem based documentation and patient instructions.  No problem-specific Assessment & Plan notes found for this encounter.  She is doing remarkably better.  We will plan to have her continue with home therapeutic exercises with slight modifications including contralateral cervical rotation with thoracic mobility  exercises.  This did give her a good stretch with interscalene muscles and anterior chain cervical muscles.  Avoid exacerbating any symptoms.  Continue previously prescribed home exercise program.   Osteopathic manipulation was performed today based on physical exam findings.  Patient has responded well to osteopathic manipulation previously the prior manipulation did not provide permanent long lasting relief.  The patient does feel as though there was significant benefit to the prior manipulation and they wished for repeat manipulation today.  They understand that home therapeutic exercises are critical part of the healing/treatment process and will continue with self treatment between now and their next visit as outlined.  The patient understands that the frequency of visits is meant to provide a stimulus to promote the body's own ability to heal and is not meant to be the sole means for improvement in their symptoms.  Activity modifications and the importance of avoiding exacerbating activities (limiting pain to no more than a 4 / 10 during or following activity) recommended and discussed.   Discussed red flag symptoms that warrant earlier emergent evaluation and patient voices understanding.    No orders of the defined types were placed in this encounter. Lab Orders  No laboratory test(s) ordered today   Imaging Orders  No imaging studies ordered today   Referral Orders  No referral(s) requested today    At follow up will plan to consider : repeat osteopathic manipulation  Return in about 4 weeks (around 04/10/2018) for consideration of repeat Osteopathic Manipulation.          Andrena Mews, DO    [facility]

## 2018-03-24 ENCOUNTER — Ambulatory Visit (INDEPENDENT_AMBULATORY_CARE_PROVIDER_SITE_OTHER): Payer: 59 | Admitting: Family Medicine

## 2018-03-24 ENCOUNTER — Encounter: Payer: Self-pay | Admitting: Family Medicine

## 2018-03-24 VITALS — BP 104/64 | HR 116 | Temp 98.6°F | Ht 65.0 in | Wt 121.4 lb

## 2018-03-24 DIAGNOSIS — L509 Urticaria, unspecified: Secondary | ICD-10-CM

## 2018-03-24 MED ORDER — EPINEPHRINE 0.3 MG/0.3ML IJ SOAJ: 0.3000 mg | INTRAMUSCULAR | 3 refills | Status: DC | PRN

## 2018-03-24 MED ORDER — PREDNISONE 20 MG PO TABS: ORAL_TABLET | ORAL | 0 refills | Status: DC

## 2018-03-24 NOTE — Progress Notes (Signed)
Patient: Angelica Sanders MRN: [id number] DOB: 26-Sep-1985 PCP: Orland Mustard, MD     Subjective:  Chief Complaint  Patient presents with  . hives all over    HPI: The patient is a 33 y.o. female who presents today for a rash all over her body that started last night around 8pm. She did have a cold recently, but felt fine yesterday and was up and running around with her kids yesterday. She took a bath and when she got out of the bath she noticed hives all over her body. They were raised and itchy. She did not take anything last night. She put some hydrocortisone cream on her legs and stomach. Today she has some new lesion on her breasts and her face is red, but no other raised hives. The other areas have coalesced together into red patches. She is still itchy everywhere. She did not take any new herbs, products, foods, plants. She did play in the snow last night before this started.   Review of Systems  Constitutional: Positive for fatigue. Negative for chills and fever.  Respiratory: Negative for shortness of breath.   Cardiovascular: Negative for chest pain.  Gastrointestinal: Negative for abdominal pain and nausea.  Skin: Positive for rash.       Hives all over body-very itchy  Neurological: Negative for dizziness and headaches.    Allergies Patient is allergic to benadryl [diphenhydramine].  Past Medical History Patient  has a past medical history of History of chicken pox, History of fainting spells of unknown cause, and Medical history non-contributory.  Surgical History Patient  has a past surgical history that includes Dilation and curettage of uterus and Wisdom tooth extraction (Bilateral).  Family History Pateint's family history includes Asthma in her maternal grandmother; Cancer in her maternal aunt, maternal grandmother, and paternal grandfather; Heart attack in her maternal grandfather; Hyperlipidemia in her father; Hypertension in her father.  Social  History Patient  reports that she has never smoked. She has never used smokeless tobacco. She reports current alcohol use. She reports that she does not use drugs.    Objective: Vitals:   03/24/18 0955  BP: 104/64  Pulse: (!) 116  Temp: 98.6 F (37 C)  TempSrc: Oral  SpO2: 98%  Weight: 121 lb 6.4 oz (55.1 kg)  Height: 5\' 5"  (1.651 m)    Body mass index is 20.2 kg/m.  Physical Exam Vitals signs reviewed.  Constitutional:      Appearance: Normal appearance.  Cardiovascular:     Rate and Rhythm: Normal rate and regular rhythm.     Heart sounds: Normal heart sounds.  Pulmonary:     Effort: Pulmonary effort is normal. No respiratory distress.     Breath sounds: Normal breath sounds. No stridor. No wheezing or rales.  Abdominal:     General: Abdomen is flat. Bowel sounds are normal.     Palpations: Abdomen is soft.  Skin:    Comments: Very mild urticaria in between breasts, but rest of urticaria has turned into erythematous rash all over body. Flat in nature.   Neurological:     General: No focal deficit present.     Mental Status: She is alert and oriented to person, place, and time.        Assessment/plan: 1. Urticaria ? If from drastic temperature change and had angioedema. Starting to resolve. Can't take bendaryl. Will put on her zyrtec and start oral steroid taper. Also sending in epi pen since quite significant urticaria. Will hold off  on allergy referral at this time.     Return if symptoms worsen or fail to improve.    Orland Mustard, MD [facility]   03/24/2018

## 2018-03-24 NOTE — Patient Instructions (Addendum)
I would take zyrtec twice a day until cleared.   Steroid course to resolve this and sending in epi pen.   Angioedema Angioedema is the sudden swelling of tissue in the body. Angioedema can affect any part of the body, but it most often affects the deeper parts of the skin, causing red, itchy patches (hives) to appear over the affected area. It often begins during the night and is found in the morning. Depending on the cause, angioedema may happen:  Only once.  Several times. It may come back in unpredictable patterns.  Repeatedly for several years. Over time, it may gradually stop coming back. Angioedema can be life-threatening if it affects the air passages that you breathe through. What are the causes? This condition may be caused by:  Foods, such as milk, eggs, shellfish, wheat, or nuts.  Certain medicines, such as ACE inhibitors, antibiotics, nonsteroidal anti-inflammatory drugs, birth control pills, or dyes used in X-rays.  Insect stings.  Infections. Angioedema can be inherited, and episodes can be triggered by:  Mild injury.  Dental work.  Surgery.  Stress.  Sudden changes in temperature.  Exercise. In some cases, the cause of this condition is not known. What are the signs or symptoms? Symptoms of this condition depend on where the swelling happens. Symptoms may include:  Swollen skin.  Red, itchy patches of skin (hives).  Redness in the affected area.  Pain in the affected area.  Swollen lips or tongue.  Wheezing.  Breathing problems.  If your internal organs are involved, symptoms may also include:  Nausea.  Abdominal pain.  Vomiting.  Difficulty swallowing.  Difficulty passing urine. How is this diagnosed? This condition may be diagnosed based on:  An exam of the affected area.  Your medical history.  Whether anyone in your family has had this condition before.  A review of any medicines you have been taking.  Tests,  including: ? Allergy skin tests to see if the condition was caused by an allergic reaction. ? Blood tests to see if the condition was caused by a gene. ? Tests to check for underlying diseases that could cause the condition. How is this treated? Treatment for this condition depends on the cause. It may involve any of the following:  If something triggered the condition, making changes to keep it from triggering the condition again.  If the condition affects your breathing, having tubes placed in your airway to keep it open.  Taking medicines to treat symptoms or prevent future episodes. These may include: ? Antihistamines. ? Epinephrine injections. ? Steroids. If your condition is severe, you may need to be treated at the hospital. Angioedema usually gets better in 24-48 hours. Follow these instructions at home:  Take over-the-counter and prescription medicines only as told by your health care provider.  If you were given medicines for emergency allergy treatment, always carry them with you.  Wear a medical bracelet as told by your health care provider.  If something triggers your condition, avoid the trigger, if possible.  If your condition is inherited and you are thinking about having children, talk to your health care provider. It is important to discuss the risks of passing on the condition to your children. Contact a health care provider if:  You have repeated episodes of angioedema.  Episodes of angioedema start to happen more often than they used to, even after you take steps to prevent them.  You have episodes of angioedema that are more severe than they have been  before, even after you take steps to prevent them.  You are thinking about having children. Get help right away if:  You have severe swelling of your mouth, tongue, or lips.  You have trouble breathing.  You have trouble swallowing.  You faint. This information is not intended to replace advice given to  you by your health care provider. Make sure you discuss any questions you have with your health care provider. Document Released: 03/29/2001 Document Revised: 08/16/2015 Document Reviewed: 07/29/2015 Elsevier Interactive Patient Education  2019 [employer].

## 2018-04-10 ENCOUNTER — Encounter: Payer: Self-pay | Admitting: Sports Medicine

## 2018-04-10 ENCOUNTER — Ambulatory Visit (INDEPENDENT_AMBULATORY_CARE_PROVIDER_SITE_OTHER): Payer: 59 | Admitting: Sports Medicine

## 2018-04-10 VITALS — BP 104/72 | HR 79 | Ht 65.0 in | Wt 119.2 lb

## 2018-04-10 DIAGNOSIS — M9901 Segmental and somatic dysfunction of cervical region: Secondary | ICD-10-CM

## 2018-04-10 DIAGNOSIS — M9908 Segmental and somatic dysfunction of rib cage: Secondary | ICD-10-CM | POA: Diagnosis not present

## 2018-04-10 DIAGNOSIS — M9902 Segmental and somatic dysfunction of thoracic region: Secondary | ICD-10-CM

## 2018-04-10 DIAGNOSIS — R079 Chest pain, unspecified: Secondary | ICD-10-CM

## 2018-04-10 NOTE — Progress Notes (Signed)
Angelica Sanders. [name] at [facility] [phone]  Angelica Sanders - 33 y.o. female MRN [id number]  Date of birth: [birth year]  Visit Date: April 10, 2018  PCP: Orland Mustard, MD   Referred by: Orland Mustard, MD  SUBJECTIVE:  Chief Complaint  Patient presents with  . Follow-up    R-sided chest pain.  HEP of t-spine mobility, rotation and pec stretching    HPI: Patient is here for reevaluation of her right-sided chest pain.  Overall she reports essentially no pain at this time.  Does tend to come and go however but is much less severe than in the past.  She is continuing to use Tumeric.  She occasionally does get some tightness into her left clavicle.  She is been working on T-spine mobility and rotation as well as pec stretching.  She is continuing with yoga and performing chest openers handstands and headstands.  REVIEW OF SYSTEMS: No significant nighttime awakenings due to this issue. Denies fevers, chills, recent weight gain or weight loss.  No night sweats.  Pt denies any change in bowel or bladder habits, muscle weakness, numbness or falls associated with this pain.  HISTORY:  Prior history reviewed and updated per electronic medical record.  Patient Active Problem List   Diagnosis Date Noted  . Right-sided chest pain 02/20/2018    CT chest 02/13/2018. Mmg/ultrasound right breast normal 06/2017 IMPRESSION: Unremarkable chest CT.    Social History   Occupational History  . Not on file  Tobacco Use  . Smoking status: Never Smoker  . Smokeless tobacco: Never Used  Substance and Sexual Activity  . Alcohol use: Yes  . Drug use: No  . Sexual activity: Yes    Birth control/protection: None   Social History   Social History Narrative  . Not on file     OBJECTIVE:  VS:  HT:5\' 5"  (165.1 cm)   WT:119 lb 3.2 oz (54.1 kg)  BMI:19.84    BP:104/72  HR:79bpm  TEMP: ( )  RESP:100 %   PHYSICAL EXAM: Adult female. No  acute distress.  Alert and appropriate. Head and neck are normal-appearing.  She has good cervical range of motion with provements and paracervical's muscle spasms.  No significant omohyoid tension at this time.  She does have some limitations in the left side bending and rotation around T6 but this is functional.  He does improve with OMT.  No significant difficulty with breathing.   ASSESSMENT:   1. Right-sided chest pain   2. Somatic dysfunction of cervical region   3. Somatic dysfunction of thoracic region   4. Somatic dysfunction of rib cage region      PROCEDURES:  PROCEDURE NOTE: OSTEOPATHIC MANIPULATION   The decision today to treat with Osteopathic Manipulative Therapy (OMT) was based on physical exam findings. Verbal consent was obtained following a discussion with the patient regarding the of risks, benefits and potential side effects, including an acute pain flare,post manipulation soreness and need for repeat treatments.  NONE  Manipulation was performed as below:  Regions Treated & Osteopathic Exam Findings   CERVICAL SPINE:   OA - rotated right C3 - 5Extended, rotated RIGHT, sidebent LEFT THORACIC SPINE:   T2 - 4 Neutral, rotated RIGHT, sidebent LEFT RIBS:   Rib 6Left  Exhalation dysfunction (elevated/inhaled)    OMT Techniques Used:   HVLA muscle energy myofascial release    The patient tolerated the treatment well and reported Improved symptoms following  treatment today. Patient was given medications, exercises, stretches and lifestyle modifications per AVS and verbally.       PLAN:  Pertinent additional documentation may be included in corresponding procedure notes, imaging studies, problem based documentation and patient instructions.  No problem-specific Assessment & Plan notes found for this encounter.  Overall she is significantly improved.  Only minimal functional limitations.  Continue previously prescribed home exercise program.   Osteopathic  manipulation was performed today based on physical exam findings.  Patient has responded well to osteopathic manipulation previously the prior manipulation did not provide permanent long lasting relief.  The patient does feel as though there was significant benefit to the prior manipulation and they wished for repeat manipulation today.  They understand that home therapeutic exercises are critical part of the healing/treatment process and will continue with self treatment between now and their next visit as outlined.  The patient understands that the frequency of visits is meant to provide a stimulus to promote the body's own ability to heal and is not meant to be the sole means for improvement in their symptoms.  Activity modifications and the importance of avoiding exacerbating activities (limiting pain to no more than a 4 / 10 during or following activity) recommended and discussed.   Discussed red flag symptoms that warrant earlier emergent evaluation and patient voices understanding.    No orders of the defined types were placed in this encounter. Lab Orders  No laboratory test(s) ordered today   Imaging Orders  No imaging studies ordered today   Referral Orders  No referral(s) requested today    At follow up will plan to consider : repeat osteopathic manipulation  Return if symptoms worsen or fail to improve, for consideration of repeat Osteopathic Manipulation.          Andrena Mews, DO    [place] Physician

## 2020-02-02 NOTE — L&D Delivery Note (Signed)
Delivery Note:   [id number] at [redacted]w[redacted]d  Admitting diagnosis: Normal labor [O80, Z37.9] Risks: None Onset of labor: 05/09/2020 1925 IOL/Augmentation: AROM, Pitocin and Cytotec ROM: 05/09/2020 1925, small amount of clear fluid  Complete dilation at 0047  Onset of pushing at 0058 FHR second stage Cat I  Analgesia /Anesthesia intrapartum:None  Pushing in hands and knees position with CNM and L&D staff support, husband, Jorja Loa, and doula, Shanda Bumps, present for birth and supportive.  Delivery of a Live born female  Birth Weight:  3246g, 7lb 2.5oz APGAR: 9, 9  Newborn Delivery   Birth date/time: 05/10/2020 01:07:00 Delivery type: Vaginal, Spontaneous      in cephalic presentation, position OA to LOA.  APGAR:1 min-9 , 5 min-9   Nuchal Cord: No  Cord double clamped after cessation of pulsation, cut by Tim.  Collection of cord blood for typing completed. Cord blood donation-None  Arterial cord blood sample-No    Placenta delivered-Spontaneous  with 3 vessels . Uterotonics: Pitocin Placenta to L&D Uterine tone firm  Bleeding small  None  laceration identified.  Episiotomy:None  Local analgesia: N/A  Repair: N/A Est. Blood Loss (mL):250.00   Complications: None  Mom to postpartum.  Baby Angelica Sanders to Couplet care / Skin to Skin.  Delivery Report:   Review the Delivery Report for details.    Angelica Sanders, CNM, MSN 05/10/2020, 6:50 AM

## 2020-03-10 ENCOUNTER — Telehealth: Payer: Self-pay

## 2020-03-10 NOTE — Telephone Encounter (Signed)
Patient is currently pregnant and would like to know if [facility] would work daughter in for NP appointment when she delivers her in April  Please advise

## 2020-03-11 NOTE — Telephone Encounter (Signed)
Please advise 

## 2020-03-13 NOTE — Telephone Encounter (Signed)
Gave pt message below. She was very understanding.

## 2020-03-13 NOTE — Telephone Encounter (Signed)
Let her know I typically see 2 years and older and would love to see her sweet baby at 2 years!  Dr. Artis Flock

## 2020-04-02 ENCOUNTER — Encounter (HOSPITAL_COMMUNITY): Payer: Self-pay

## 2020-04-02 ENCOUNTER — Other Ambulatory Visit: Payer: Self-pay

## 2020-04-02 ENCOUNTER — Inpatient Hospital Stay (HOSPITAL_COMMUNITY)
Admission: AD | Admit: 2020-04-02 | Discharge: 2020-04-02 | Disposition: A | Discharge status: Home / Self Care | Payer: 59 | Attending: Obstetrics and Gynecology | Admitting: Obstetrics and Gynecology

## 2020-04-02 DIAGNOSIS — Z7982 Long term (current) use of aspirin: Secondary | ICD-10-CM | POA: Diagnosis not present

## 2020-04-02 DIAGNOSIS — O99013 Anemia complicating pregnancy, third trimester: Secondary | ICD-10-CM | POA: Diagnosis not present

## 2020-04-02 DIAGNOSIS — Z888 Allergy status to other drugs, medicaments and biological substances status: Secondary | ICD-10-CM | POA: Diagnosis not present

## 2020-04-02 DIAGNOSIS — O4700 False labor before 37 completed weeks of gestation, unspecified trimester: Secondary | ICD-10-CM | POA: Diagnosis present

## 2020-04-02 DIAGNOSIS — O09213 Supervision of pregnancy with history of pre-term labor, third trimester: Secondary | ICD-10-CM | POA: Insufficient documentation

## 2020-04-02 DIAGNOSIS — D649 Anemia, unspecified: Secondary | ICD-10-CM | POA: Insufficient documentation

## 2020-04-02 DIAGNOSIS — Z3A32 32 weeks gestation of pregnancy: Secondary | ICD-10-CM | POA: Insufficient documentation

## 2020-04-02 LAB — URINALYSIS, ROUTINE W REFLEX MICROSCOPIC
Bilirubin Urine: NEGATIVE
Glucose, UA: NEGATIVE mg/dL
Hgb urine dipstick: NEGATIVE
Ketones, ur: 20 mg/dL — AB
Leukocytes,Ua: NEGATIVE
Nitrite: NEGATIVE
Protein, ur: NEGATIVE mg/dL
Specific Gravity, Urine: 1.002 — ABNORMAL LOW (ref 1.005–1.030)
pH: 7 (ref 5.0–8.0)

## 2020-04-02 MED ORDER — SODIUM CHLORIDE 0.9 % IV SOLN: Freq: Once | INTRAVENOUS | Status: AC

## 2020-04-02 MED ORDER — SODIUM CHLORIDE 0.9 % IV SOLN
510.0000 mg | INTRAVENOUS | Status: DC
  Administered 2020-04-02: 510 mg via INTRAVENOUS
  Filled 2020-04-02: qty 17

## 2020-04-02 NOTE — MAU Provider Note (Signed)
History    Angelica Sanders is a female 35 y.o. [phone] at [redacted]w[redacted]d sent from office for recurrent contractions. Started feeling tightening yesterday then resumed this morning. Her cervix exam in office today was 1/20/-2, funneling internally. FfN was collected and sent prior to digital exam.  Of significance, patient has one prior birth at preterm for PPROM with subsequent IOL at 36.6 wks. Declined Makena.  Progressive maternal anemia with nadir to hgb 8.8 yesterday, patient not tolerating PO iron well d/t constipation effect.  Denies LOF/VB. Contractions felt q 10 min approx., and only tightening and pelvic pressure, no pain.   CSN: [id number]  Arrival date and time: 04/02/20 1258   Event Date/Time   First Provider Initiated Contact with Patient 04/02/20 1401      Chief Complaint  Patient presents with  . Contractions   HPI  OB History    Gravida  6   Para  1   Term      Preterm  1   AB  4   Living  1     SAB  4   IAB      Ectopic      Multiple      Live Births  1           Past Medical History:  Diagnosis Date  . History of chicken pox   . History of fainting spells of unknown cause   . Medical history non-contributory     Past Surgical History:  Procedure Laterality Date  . DILATION AND CURETTAGE OF UTERUS    . WISDOM TOOTH EXTRACTION Bilateral     Family History  Problem Relation Age of Onset  . Cancer Maternal Aunt   . Cancer Maternal Grandmother   . Asthma Maternal Grandmother   . Hyperlipidemia Father   . Hypertension Father   . Heart attack Maternal Grandfather   . Cancer Paternal Grandfather     Social History   Tobacco Use  . Smoking status: Never Smoker  . Smokeless tobacco: Never Used  Vaping Use  . Vaping Use: Never used  Substance Use Topics  . Alcohol use: Not Currently  . Drug use: No    Allergies:  Allergies  Allergen Reactions  . Benadryl [Diphenhydramine]     Pt states that it makes her "lose a chunk of time"     Medications Prior to Admission  Medication Sig Dispense Refill Last Dose  . aspirin EC 81 MG tablet Take 81 mg by mouth daily. Swallow whole.   04/02/2020 at Unknown time  . Biotin 1 MG CAPS Take by mouth.   More than a month at Unknown time  . EPINEPHrine 0.3 mg/0.3 mL IJ SOAJ injection Inject 0.3 mLs (0.3 mg total) into the muscle as needed for anaphylaxis. 1 Device 3   . vitamin B-12 (CYANOCOBALAMIN) 1000 MCG tablet Take 1,000 mcg by mouth daily.       Review of Systems  As noted above  Physical Exam   Vitals:   04/02/20 1310 04/02/20 1319 04/02/20 1320  BP:   123/60  Pulse:   (!) 105  Temp:  98.1 F (36.7 C)   Resp:  12   Weight: 67.6 kg    SpO2:  100% 99%   Physical Exam  Gen: AAO x 3, NAD Abd: gravid, NT, S=D GU: normal genitalia, no lesions Cvx: 1/thick/-3, IBOW, some funneling from external to internal os, no VB/discharge Ext: no edema, no cords or tenderness  FHT: 130 BPM  BL, moderate variability, + accels, no decels Toco: no ctx pattern noted on EFM, none palp while at [facility].    MAU Course  Procedures Results for orders placed or performed during the hospital encounter of 04/02/20 (from the past 24 hour(s))  Urinalysis, Routine w reflex microscopic Urine, Clean Catch     Status: Abnormal   Collection Time: 04/02/20  1:07 PM  Result Value Ref Range   Color, Urine COLORLESS (A) YELLOW   APPearance CLEAR CLEAR   Specific Gravity, Urine 1.002 (L) 1.005 - 1.030   pH 7.0 5.0 - 8.0   Glucose, UA NEGATIVE NEGATIVE mg/dL   Hgb urine dipstick NEGATIVE NEGATIVE   Bilirubin Urine NEGATIVE NEGATIVE   Ketones, ur 20 (A) NEGATIVE mg/dL   Protein, ur NEGATIVE NEGATIVE mg/dL   Nitrite NEGATIVE NEGATIVE   Leukocytes,Ua NEGATIVE NEGATIVE    MDM Patient monitored for 40 min and repeat cervical exam w/o evidence of cervical change, no regular contraction pattern observed, however patient noted still feel intermittent pelvic pressure and abdominal tightening q 10 min.  No  evidence of UTI per UA.  Discussed possibility of fetal lung maturation with slight increase of PTB d/t hx of. At this time it does not appear to be active labor and will defer BMZ until results of FfN back. Will revisit steroids option if test positive, patient feels comfortable with that plan.  We recommend parenteral iron for critical anemia and patient agrees, IV Feraheme one dose now, to be repeated in 1 week.  Will discharge home with strong PTL precautions on modified rest and close F/U in office.   Assessment and Plan  35 y.o. [id number] at [redacted]w[redacted]d  Preterm contractions without cervical change Hx preterm birth 36.6 wks Severe IDA FHT category 1  IV Feraheme 500 mg x 2 doses q week, first dose now Monitor closely for PTL/cervical change, close f/u in office in 2 days Modified rest at home Low threshold for BMZ  Dr/ Law consult for POC.    Neta Mends, MSN, CNM 04/02/2020, 2:51 PM

## 2020-04-02 NOTE — MAU Note (Signed)
Pt reports ctx's that started last night and were 6-8 minutes apart.   Pt reports going into office today and was sent here for further evaluation.

## 2020-04-03 ENCOUNTER — Other Ambulatory Visit: Payer: Self-pay | Admitting: Obstetrics and Gynecology

## 2020-04-03 DIAGNOSIS — O99013 Anemia complicating pregnancy, third trimester: Secondary | ICD-10-CM

## 2020-04-10 ENCOUNTER — Encounter (HOSPITAL_COMMUNITY)
Admission: RE | Admit: 2020-04-10 | Discharge: 2020-04-10 | Disposition: A | Discharge status: Home / Self Care | Payer: 59 | Source: Ambulatory Visit | Attending: Obstetrics | Admitting: Obstetrics

## 2020-04-10 ENCOUNTER — Other Ambulatory Visit: Payer: Self-pay

## 2020-04-10 DIAGNOSIS — O99013 Anemia complicating pregnancy, third trimester: Secondary | ICD-10-CM | POA: Insufficient documentation

## 2020-04-10 MED ORDER — SODIUM CHLORIDE 0.9 % IV SOLN
510.0000 mg | Freq: Once | INTRAVENOUS | Status: AC
  Administered 2020-04-10: 510 mg via INTRAVENOUS
  Filled 2020-04-10: qty 510

## 2020-05-09 ENCOUNTER — Inpatient Hospital Stay (HOSPITAL_COMMUNITY)
Admission: AD | Admit: 2020-05-09 | Discharge: 2020-05-11 | DRG: 806 | Disposition: A | Discharge status: Home / Self Care | Payer: 59 | Attending: Obstetrics and Gynecology | Admitting: Obstetrics and Gynecology

## 2020-05-09 ENCOUNTER — Other Ambulatory Visit: Payer: Self-pay

## 2020-05-09 ENCOUNTER — Encounter (HOSPITAL_COMMUNITY): Payer: Self-pay | Admitting: Obstetrics and Gynecology

## 2020-05-09 DIAGNOSIS — Z20822 Contact with and (suspected) exposure to covid-19: Secondary | ICD-10-CM | POA: Diagnosis present

## 2020-05-09 DIAGNOSIS — Z3A37 37 weeks gestation of pregnancy: Secondary | ICD-10-CM

## 2020-05-09 DIAGNOSIS — O9081 Anemia of the puerperium: Secondary | ICD-10-CM | POA: Diagnosis not present

## 2020-05-09 DIAGNOSIS — O4292 Full-term premature rupture of membranes, unspecified as to length of time between rupture and onset of labor: Principal | ICD-10-CM | POA: Diagnosis present

## 2020-05-09 DIAGNOSIS — D62 Acute posthemorrhagic anemia: Secondary | ICD-10-CM | POA: Diagnosis not present

## 2020-05-09 DIAGNOSIS — O26893 Other specified pregnancy related conditions, third trimester: Secondary | ICD-10-CM | POA: Diagnosis present

## 2020-05-09 LAB — TYPE AND SCREEN
ABO/RH(D): O POS
Antibody Screen: NEGATIVE

## 2020-05-09 LAB — CBC
HCT: 35.8 % — ABNORMAL LOW (ref 36.0–46.0)
Hemoglobin: 11.7 g/dL — ABNORMAL LOW (ref 12.0–15.0)
MCH: 29.7 pg (ref 26.0–34.0)
MCHC: 32.7 g/dL (ref 30.0–36.0)
MCV: 90.9 fL (ref 80.0–100.0)
Platelets: 249 10*3/uL (ref 150–400)
RBC: 3.94 MIL/uL (ref 3.87–5.11)
RDW: 16.7 % — ABNORMAL HIGH (ref 11.5–15.5)
WBC: 9.5 10*3/uL (ref 4.0–10.5)
nRBC: 0 % (ref 0.0–0.2)

## 2020-05-09 LAB — RESP PANEL BY RT-PCR (FLU A&B, COVID) ARPGX2
Influenza A by PCR: NEGATIVE
Influenza B by PCR: NEGATIVE
SARS Coronavirus 2 by RT PCR: NEGATIVE

## 2020-05-09 MED ORDER — LACTATED RINGERS IV SOLN: 500.0000 mL | INTRAVENOUS | Status: DC | PRN

## 2020-05-09 MED ORDER — TERBUTALINE SULFATE 1 MG/ML IJ SOLN: 0.2500 mg | Freq: Once | INTRAMUSCULAR | Status: DC | PRN

## 2020-05-09 MED ORDER — FENTANYL CITRATE (PF) 100 MCG/2ML IJ SOLN: 50.0000 ug | INTRAMUSCULAR | Status: DC | PRN

## 2020-05-09 MED ORDER — SODIUM CHLORIDE 0.9% FLUSH: 3.0000 mL | Freq: Two times a day (BID) | INTRAVENOUS | Status: DC

## 2020-05-09 MED ORDER — LIDOCAINE HCL (PF) 1 % IJ SOLN: 30.0000 mL | INTRAMUSCULAR | Status: DC | PRN

## 2020-05-09 MED ORDER — ONDANSETRON HCL 4 MG/2ML IJ SOLN: 4.0000 mg | Freq: Four times a day (QID) | INTRAMUSCULAR | Status: DC | PRN

## 2020-05-09 MED ORDER — SODIUM CHLORIDE 0.9% FLUSH: 3.0000 mL | INTRAVENOUS | Status: DC | PRN

## 2020-05-09 MED ORDER — OXYTOCIN BOLUS FROM INFUSION
333.0000 mL | Freq: Once | INTRAVENOUS | Status: AC
  Administered 2020-05-10: 333 mL via INTRAVENOUS

## 2020-05-09 MED ORDER — OXYTOCIN-SODIUM CHLORIDE 30-0.9 UT/500ML-% IV SOLN
1.0000 m[IU]/min | INTRAVENOUS | Status: DC
  Administered 2020-05-09: 2 m[IU]/min via INTRAVENOUS
  Filled 2020-05-09: qty 500

## 2020-05-09 MED ORDER — OXYTOCIN 10 UNIT/ML IJ SOLN: 10.0000 [IU] | Freq: Once | INTRAMUSCULAR | Status: DC

## 2020-05-09 MED ORDER — OXYTOCIN-SODIUM CHLORIDE 30-0.9 UT/500ML-% IV SOLN: 2.5000 [IU]/h | INTRAVENOUS | Status: DC

## 2020-05-09 MED ORDER — SODIUM CHLORIDE 0.9 % IV SOLN: 250.0000 mL | INTRAVENOUS | Status: DC | PRN

## 2020-05-09 MED ORDER — LACTATED RINGERS IV SOLN: INTRAVENOUS | Status: DC

## 2020-05-09 MED ORDER — SOD CITRATE-CITRIC ACID 500-334 MG/5ML PO SOLN: 30.0000 mL | ORAL | Status: DC | PRN

## 2020-05-09 MED ORDER — MISOPROSTOL 50MCG HALF TABLET
50.0000 ug | ORAL_TABLET | ORAL | Status: DC
  Administered 2020-05-09: 50 ug via ORAL
  Filled 2020-05-09: qty 1

## 2020-05-09 MED ORDER — ACETAMINOPHEN 500 MG PO TABS: 1000.0000 mg | ORAL_TABLET | Freq: Four times a day (QID) | ORAL | Status: DC | PRN

## 2020-05-09 NOTE — H&P (Signed)
OB ADMISSION/ HISTORY & PHYSICAL:  Admission Date: 05/09/2020  2:21 PM  Admit Diagnosis: Normal labor [O80, Z37.9]    Angelica Sanders is a 35 y.o. female at [redacted]w[redacted]d presenting for SROM. Patient was seen in the office by Dr. Conni Elliot this AM reporting contractions and leaking of fluid. VE 2cm with positive ferning. Patient was direct admitted for labor augmentation. Denies vaginal bleeding. Endorses + fetal movement. Husband, Marcial Pacas, at the bedside providing labor support. Eagerly anticipating baby girl "Tulsi".   Prenatal History: [id number]   EDC : 05/25/2020 Prenatal care at [facility] since 10 weeks, primary M. Sigmon CNM  Prenatal course complicated by: 1. RPL; BBASA until 36 weeks 2. Anemia, on oral Fe, s/p 2 doses of IV iron  Prenatal Labs: ABO, Rh:   O POS Antibody: NEG (04/08 1500) Rubella:   Immune RPR:   Non-reactive HBsAg:   Negative HIV:   Negative GBS:   Negative 1 hr Glucola : 95 Genetic Screening: low risk Panorama, AFP-1 neg Ultrasound: normal XX anatomy, anterior placenta, AFI WNL  TDaP          Declined COVID-19 UTD    Maternal Diabetes: No Genetic Screening: Normal Maternal Ultrasounds/Referrals: Normal Fetal Ultrasounds or other Referrals:  None Maternal Substance Abuse:  No Significant Maternal Medications:  None Significant Maternal Lab Results:  Group B Strep negative Other Comments:  None  Medical / Surgical History : Past medical history:  Past Medical History:  Diagnosis Date  . History of chicken pox   . History of fainting spells of unknown cause   . Medical history non-contributory     Past surgical history:  Past Surgical History:  Procedure Laterality Date  . DILATION AND CURETTAGE OF UTERUS    . WISDOM TOOTH EXTRACTION Bilateral     Family History:  Family History  Problem Relation Age of Onset  . Cancer Maternal Aunt   . Cancer Maternal Grandmother   . Asthma Maternal Grandmother   . Hyperlipidemia Father   . Hypertension Father    . Heart attack Maternal Grandfather   . Cancer Paternal Grandfather     Social History:  reports that she has never smoked. She has never used smokeless tobacco. She reports previous alcohol use. She reports that she does not use drugs. Allergies: Benadryl [diphenhydramine]   Current Medications at time of admission:  Medications Prior to Admission  Medication Sig Dispense Refill Last Dose  . aspirin EC 81 MG tablet Take 81 mg by mouth daily. Swallow whole.     . Biotin 1 MG CAPS Take by mouth.     . EPINEPHrine 0.3 mg/0.3 mL IJ SOAJ injection Inject 0.3 mLs (0.3 mg total) into the muscle as needed for anaphylaxis. 1 Device 3   . vitamin B-12 (CYANOCOBALAMIN) 1000 MCG tablet Take 1,000 mcg by mouth daily.       Review of Systems: Review of Systems  All other systems reviewed and are negative.  Physical Exam: Vital signs and nursing notes reviewed.  Patient Vitals for the past 24 hrs:  BP Pulse Resp Height Weight  05/09/20 1648 -- -- -- 5\' 4"  (1.626 m) 68 kg  05/09/20 1613 119/70 88 16 -- --    General: AAO x 3, NAD Heart: RRR Lungs:CTAB Abdomen: Gravid, NT Extremities: no edema Genitalia / VE: Dilation: 2 (in office) Presentation: Vertex Exam by:: ultrasound by wilkins, rn   FHR: 145BPM, mod variability, + accels, no decels TOCO: Contractions occasional  Labs:   Pending T&S,  CBC, RPR  Recent Labs    05/09/20 1455  WBC 9.5  HGB 11.7*  HCT 35.8*  PLT 249   Assessment:  35 y.o. [id number] at [redacted]w[redacted]d, PROM  1. PROM at 1030, clear fluid 2. FHR category 1 3. GBS negative 4. Desires unmedicated labor 5. Plans to breastfeed 6. Placenta for disposal  Plan:  1. Admit to BS 2. Routine L&D orders 3. Analgesia/anesthesia PRN  4. Buccal Cytotec q 4 hour until adequate contraction pattern 5. Anticipate NSVB  Dr. Amado Nash notified of admission/plan of care.  June Leap CNM, MSN 05/09/2020, 6:02 PM

## 2020-05-09 NOTE — Progress Notes (Signed)
S: Feeling some increased cramping. Husband, Marcial Pacas, and doula, Shanda Bumps, at the bedside providing support. Discussed the R/B/A of AROM of forebag and Pitocin for labor augmentation. Patient consents to procedure.   O: Vitals:   05/09/20 1613 05/09/20 1648 05/09/20 1838  BP: 119/70    Pulse: 88    Resp: 16    Temp:   98 F (36.7 C)  TempSrc:   Oral  Weight:  68 kg   Height:  5\' 4"  (1.626 m)    FHT:  FHR: 155 bpm, variability: moderate,  accelerations:  Present,  decelerations:  Absent UC:   irregular SVE:   Dilation: 3.5 Effacement (%): 70 Station: -2 Exam by:: [email or web address] CNM   AROM of forebag with a moderate amount of clear fluid at 1925.  A / P: Augmentation of labor after PROM at 1030, s/p 1 dose of buccal Cytotec, forebag AROM  Fetal Wellbeing:  Category I Pain Control:  Labor support without medications Anticipated MOD:  NSVD   Will start Pitocin 2x2 until adequate contraction pattern is established.   Dorisann Frames, CNM, MSN 05/09/2020, 7:33 PM

## 2020-05-10 ENCOUNTER — Encounter: Payer: Self-pay | Admitting: Certified Nurse Midwife

## 2020-05-10 DIAGNOSIS — D62 Acute posthemorrhagic anemia: Secondary | ICD-10-CM | POA: Diagnosis not present

## 2020-05-10 LAB — CBC
HCT: 30.1 % — ABNORMAL LOW (ref 36.0–46.0)
HCT: 26.9 % — ABNORMAL LOW (ref 36.0–46.0)
Hemoglobin: 9.8 g/dL — ABNORMAL LOW (ref 12.0–15.0)
Hemoglobin: 8.7 g/dL — ABNORMAL LOW (ref 12.0–15.0)
MCH: 29.5 pg (ref 26.0–34.0)
MCH: 30 pg (ref 26.0–34.0)
MCHC: 32.6 g/dL (ref 30.0–36.0)
MCHC: 32.3 g/dL (ref 30.0–36.0)
MCV: 90.7 fL (ref 80.0–100.0)
MCV: 92.8 fL (ref 80.0–100.0)
Platelets: 219 10*3/uL (ref 150–400)
Platelets: 224 10*3/uL (ref 150–400)
RBC: 3.32 MIL/uL — ABNORMAL LOW (ref 3.87–5.11)
RBC: 2.9 MIL/uL — ABNORMAL LOW (ref 3.87–5.11)
RDW: 16.7 % — ABNORMAL HIGH (ref 11.5–15.5)
RDW: 17 % — ABNORMAL HIGH (ref 11.5–15.5)
WBC: 20.6 10*3/uL — ABNORMAL HIGH (ref 4.0–10.5)
WBC: 18 10*3/uL — ABNORMAL HIGH (ref 4.0–10.5)
nRBC: 0 % (ref 0.0–0.2)
nRBC: 0 % (ref 0.0–0.2)

## 2020-05-10 LAB — RPR: RPR Ser Ql: NONREACTIVE

## 2020-05-10 MED ORDER — COCONUT OIL OIL: 1.0000 "application " | TOPICAL_OIL | Status: DC | PRN

## 2020-05-10 MED ORDER — METHYLERGONOVINE MALEATE 0.2 MG/ML IJ SOLN: 0.2000 mg | Freq: Once | INTRAMUSCULAR | Status: AC

## 2020-05-10 MED ORDER — TETANUS-DIPHTH-ACELL PERTUSSIS 5-2.5-18.5 LF-MCG/0.5 IM SUSY: 0.5000 mL | PREFILLED_SYRINGE | Freq: Once | INTRAMUSCULAR | Status: DC

## 2020-05-10 MED ORDER — SODIUM CHLORIDE 0.9 % IV SOLN
500.0000 mg | Freq: Once | INTRAVENOUS | Status: AC
  Administered 2020-05-10: 500 mg via INTRAVENOUS
  Filled 2020-05-10: qty 25

## 2020-05-10 MED ORDER — ONDANSETRON HCL 4 MG PO TABS: 4.0000 mg | ORAL_TABLET | ORAL | Status: DC | PRN

## 2020-05-10 MED ORDER — ACETAMINOPHEN 325 MG PO TABS: 650.0000 mg | ORAL_TABLET | ORAL | Status: DC | PRN

## 2020-05-10 MED ORDER — METHYLERGONOVINE MALEATE 0.2 MG PO TABS
0.2000 mg | ORAL_TABLET | Freq: Three times a day (TID) | ORAL | Status: AC
  Administered 2020-05-10 (×3): 0.2 mg via ORAL
  Filled 2020-05-10 (×3): qty 1

## 2020-05-10 MED ORDER — DIBUCAINE (PERIANAL) 1 % EX OINT: 1.0000 "application " | TOPICAL_OINTMENT | CUTANEOUS | Status: DC | PRN

## 2020-05-10 MED ORDER — METHYLERGONOVINE MALEATE 0.2 MG/ML IJ SOLN
INTRAMUSCULAR | Status: AC
  Administered 2020-05-10: 0.2 mg via INTRAMUSCULAR
  Filled 2020-05-10: qty 1

## 2020-05-10 MED ORDER — LACTATED RINGERS IV SOLN: Freq: Once | INTRAVENOUS | Status: DC

## 2020-05-10 MED ORDER — ONDANSETRON HCL 4 MG/2ML IJ SOLN: 4.0000 mg | INTRAMUSCULAR | Status: DC | PRN

## 2020-05-10 MED ORDER — BENZOCAINE-MENTHOL 20-0.5 % EX AERO
1.0000 | INHALATION_SPRAY | CUTANEOUS | Status: DC | PRN
Start: 2020-05-10 — End: 2020-05-11

## 2020-05-10 MED ORDER — SIMETHICONE 80 MG PO CHEW: 80.0000 mg | CHEWABLE_TABLET | ORAL | Status: DC | PRN

## 2020-05-10 MED ORDER — FENTANYL CITRATE (PF) 100 MCG/2ML IJ SOLN
INTRAMUSCULAR | Status: AC
  Administered 2020-05-10: 50 ug
  Filled 2020-05-10: qty 2

## 2020-05-10 MED ORDER — OXYTOCIN-SODIUM CHLORIDE 30-0.9 UT/500ML-% IV SOLN
INTRAVENOUS | Status: AC
  Filled 2020-05-10: qty 500

## 2020-05-10 MED ORDER — SENNOSIDES-DOCUSATE SODIUM 8.6-50 MG PO TABS: 2.0000 | ORAL_TABLET | Freq: Every day | ORAL | Status: DC

## 2020-05-10 MED ORDER — SODIUM CHLORIDE 0.9 % IV SOLN: INTRAVENOUS | Status: DC

## 2020-05-10 MED ORDER — SODIUM CHLORIDE 0.9 % IV SOLN
3.0000 g | Freq: Once | INTRAVENOUS | Status: AC
  Administered 2020-05-10: 3 g via INTRAVENOUS
  Filled 2020-05-10: qty 3

## 2020-05-10 MED ORDER — IBUPROFEN 600 MG PO TABS
600.0000 mg | ORAL_TABLET | Freq: Four times a day (QID) | ORAL | Status: DC
  Administered 2020-05-10: 600 mg via ORAL
  Filled 2020-05-10: qty 1

## 2020-05-10 MED ORDER — WITCH HAZEL-GLYCERIN EX PADS: 1.0000 "application " | MEDICATED_PAD | CUTANEOUS | Status: DC | PRN

## 2020-05-10 MED ORDER — PRENATAL MULTIVITAMIN CH: 1.0000 | ORAL_TABLET | Freq: Every day | ORAL | Status: DC

## 2020-05-10 MED ORDER — ZOLPIDEM TARTRATE 5 MG PO TABS: 5.0000 mg | ORAL_TABLET | Freq: Every evening | ORAL | Status: DC | PRN

## 2020-05-10 MED ORDER — LACTATED RINGERS IV SOLN: INTRAVENOUS | Status: DC

## 2020-05-10 NOTE — Progress Notes (Signed)
Angelica Sanders, Midwife called and is on her way in to evaluate patient.

## 2020-05-10 NOTE — Progress Notes (Signed)
Angelica Sanders, Midwife called to check on patient and get an update. Patient continues to have trickle of bleeding. Fundus firm, at 1 below. No passing of clots at this time. New orders to restart patient's IV and infusion of Pitocin at 160 ml/hr.

## 2020-05-10 NOTE — Progress Notes (Addendum)
Called by RN reporting large gush of blood after getting up to the bathroom. Several small clots were expressed with fundal massage and EBL was reported at 615cc total. Orders given for methergine IM. RN contacted CNM after methergine was given reporting normal vital signs, fundus firm, and small trickle but no clots. Total EBL reported as 982cc. CNM en route to the hospital. Orders given to restart IV and start Pitocin.  CNM arrived at the bedside at [phone]. Patient was asymptomatic and vital signs stable. Small trickle noted. Fundal massage expressed golf ball sized clot. Discussed the R/B/A of manual exploration of uterus and patient consented to procedure. of Fentanyl given IV push by RN. Manual exploration revealed firm lower uterine segment and no clots or evidence of active bleeding. Patient tolerated procedure well. Reassured of normal findings.  Total EBL 1181cc. Will continue IV Pitocin then infuse LR at 125cc/hr. Will give Unasyn 3 gram for prophylaxis after manual exploration. PO methergine series ordered.  CBC at 0500 with hemoglobin 9.8, down from 11.7. Will give IV iron. Recheck CBC in 6 hours. Dr. Amado Nash updated and consulted. Agrees with plan of care.

## 2020-05-10 NOTE — Plan of Care (Signed)

## 2020-05-10 NOTE — Progress Notes (Signed)
Dorisann Frames, Midwife at patient's bedside to do a vaginal manual exam on patient. Needed supplies at bedside. New orders received.

## 2020-05-11 MED ORDER — IBUPROFEN 600 MG PO TABS
600.0000 mg | ORAL_TABLET | Freq: Four times a day (QID) | ORAL | 0 refills | Status: AC
Start: 2020-05-11 — End: ?

## 2020-05-11 MED ORDER — POLYSACCHARIDE IRON COMPLEX 150 MG PO CAPS: 150.0000 mg | ORAL_CAPSULE | Freq: Every day | ORAL | 1 refills | Status: AC

## 2020-05-11 MED ORDER — ACETAMINOPHEN 325 MG PO TABS: 650.0000 mg | ORAL_TABLET | ORAL | 1 refills | Status: AC | PRN

## 2020-05-11 NOTE — Discharge Summary (Signed)
OB Discharge Summary  Patient Name: Angelica Sanders DOB: 1985-07-11 MRN: [id number]  Date of admission: 05/09/2020 Delivering provider: Dorisann Frames K   Admitting diagnosis: Normal labor [O80, Z37.9] Intrauterine pregnancy: Unknown     Secondary diagnosis: Patient Active Problem List   Diagnosis Date Noted  . SVD 4/9 05/10/2020  . Postpartum care following vaginal delivery 4/9 05/10/2020  . Anemia associated with acute blood loss 05/10/2020  . Postpartum hemorrhage EBL 1181cc 05/10/2020  . Normal labor 05/09/2020    Date of discharge: 05/11/2020   Discharge diagnosis: Principal Problem:   Postpartum care following vaginal delivery 4/9 Active Problems:   Normal labor   SVD 4/9   Anemia associated with acute blood loss   Postpartum hemorrhage EBL 1181cc                                                             Post partum procedures:IV iron  Augmentation: AROM, Pitocin and Cytotec Pain control: None  Laceration:None  Episiotomy:None  Complications: Hemorrhage>1041mL  Hospital course:  Onset of Labor With Vaginal Delivery      35 y.o. yo [id number] at Unknown was admitted in Latent Labor on 05/09/2020. Patient had an uncomplicated labor course as follows:  Membrane Rupture Time/Date: 7:25 PM ,05/09/2020   Delivery Method:Vaginal, Spontaneous  Episiotomy: None  Lacerations:  None  Patient had an uncomplicated postpartum course.  She is ambulating, tolerating a regular diet, passing flatus, and urinating well. Patient is discharged home in stable condition on 05/11/20.  Newborn Data: Birth date:05/10/2020  Birth time:1:07 AM  Gender:Female  Living status:Living  Apgars:9 ,9  Weight:3246 g   Physical exam  Vitals:   05/10/20 1145 05/10/20 1611 05/10/20 2000 05/11/20 0549  BP: 117/72 117/74 107/69 103/80  Pulse: (!) 105 95 96 79  Resp: 18 18 17 18   Temp: 98.9 F (37.2 C) 98.6 F (37 C) 98.6 F (37 C) 98.6 F (37 C)  TempSrc: Oral Oral Oral Oral  SpO2: 98% 96% 97% 100%   Weight:      Height:       General: alert, cooperative and no distress Lochia: appropriate Uterine Fundus: firm Perineum: intact DVT Evaluation: No evidence of DVT seen on physical exam. No significant calf/ankle edema. Labs: Lab Results  Component Value Date   WBC 18.0 (H) 05/10/2020   HGB 8.7 (L) 05/10/2020   HCT 26.9 (L) 05/10/2020   MCV 92.8 05/10/2020   PLT 224 05/10/2020   CMP Latest Ref Rng & Units 02/03/2018  Glucose 70 - 99 mg/dL 89  BUN 6 - 20 mg/dL 10  Creatinine 04/04/2018 - 9.50 mg/dL 9.32  Sodium 6.71 - 245 mmol/L 138  Potassium 3.5 - 5.1 mmol/L 4.0  Chloride 98 - 111 mmol/L 103  CO2 22 - 32 mmol/L 26  Calcium 8.9 - 10.3 mg/dL 9.7  Total Protein 6.5 - 8.1 g/dL 7.2  Total Bilirubin 0.3 - 1.2 mg/dL 0.6  Alkaline Phos 38 - 126 U/L 43  AST 15 - 41 U/L 19  ALT 0 - 44 U/L 14   Edinburgh Postnatal Depression Scale Screening Tool 05/11/2020  I have been able to laugh and see the funny side of things. 0  I have looked forward with enjoyment to things. 0  I have blamed myself unnecessarily when things  went wrong. 1  I have been anxious or worried for no good reason. 1  I have felt scared or panicky for no good reason. 1  Things have been getting on top of me. 0  I have been so unhappy that I have had difficulty sleeping. 0  I have felt sad or miserable. 0  I have been so unhappy that I have been crying. 0  The thought of harming myself has occurred to me. 0  Edinburgh Postnatal Depression Scale Total 3   Vaccines: TDaP Declined         COVID-19   UTD  Discharge instructions:  per After Visit Summary  After Visit Meds:  Allergies as of 05/11/2020      Reactions   Benadryl [diphenhydramine]    Pt states that it makes her "lose a chunk of time"      Medication List    STOP taking these medications   aspirin EC 81 MG tablet   Biotin 1 MG Caps   EPINEPHrine 0.3 mg/0.3 mL Soaj injection Commonly known as: EPI-PEN   Poly-Iron 150 Forte 150-0.025-1 MG  Caps Generic drug: Iron Polysacch [id number]     TAKE these medications   acetaminophen 325 MG tablet Commonly known as: Tylenol Take 2 tablets (650 mg total) by mouth every 4 (four) hours as needed (for pain scale < 4).   ibuprofen 600 MG tablet Commonly known as: ADVIL Take 1 tablet (600 mg total) by mouth every 6 (six) hours.   iron polysaccharides 150 MG capsule Commonly known as: Ferrex 150 Take 1 capsule (150 mg total) by mouth daily.   Probiotic Acidophilus Tabs Take 2 tablets by mouth daily at 12 noon.   vitamin B-12 1000 MCG tablet Commonly known as: CYANOCOBALAMIN Take 1,000 mcg by mouth daily.      Diet: routine diet  Activity: Advance as tolerated. Pelvic rest for 6 weeks.   Newborn Data: Live born female  Birth Weight: 7 lb 2.5 oz (3246 g) APGAR: 9, 9  Newborn Delivery   Birth date/time: 05/10/2020 01:07:00 Delivery type: Vaginal, Spontaneous     Named Tulsi Baby Feeding: Breast Disposition:home with mother  Delivery Report:  Review the Delivery Report for details.    Follow up:  Follow-up Information    June Leap, CNM. Schedule an appointment as soon as possible for a visit in 6 week(s).   Specialty: Certified Nurse Midwife Contact information: [address] [place] [address] [phone]              June Leap, CNM, MSN 05/11/2020, 11:29 AM

## 2020-05-25 ENCOUNTER — Inpatient Hospital Stay (HOSPITAL_COMMUNITY): Admit: 2020-05-25 | Payer: Self-pay

## 2020-08-14 ENCOUNTER — Telehealth: Payer: Self-pay

## 2020-08-14 NOTE — Telephone Encounter (Signed)
LVM asking the patient to call back to get scheduled for a TOC 

## 2020-08-27 IMAGING — CT CT CHEST W/ CM
2 of 4 series · 12 of 36 positions shown, 15 images · IV contrast (iopamidol)
Comparison: Chest x-ray 02/03/2018

CLINICAL DATA: Chronic atypical right chest pain.

EXAM:
CT CHEST WITH CONTRAST
TECHNIQUE: Multidetector CT imaging of the chest was performed during
intravenous contrast administration.
CONTRAST:  75mL ZXMLSI-OFF IOPAMIDOL (ZXMLSI-OFF) INJECTION 61%

[Series 2: chest 2.00 br40 s3 ax · axial · 0.43mm/px · z∈[+1621,+1889]mm · 9 of 160 slices shown, 12 images]
[im 13/160  mediastinal]
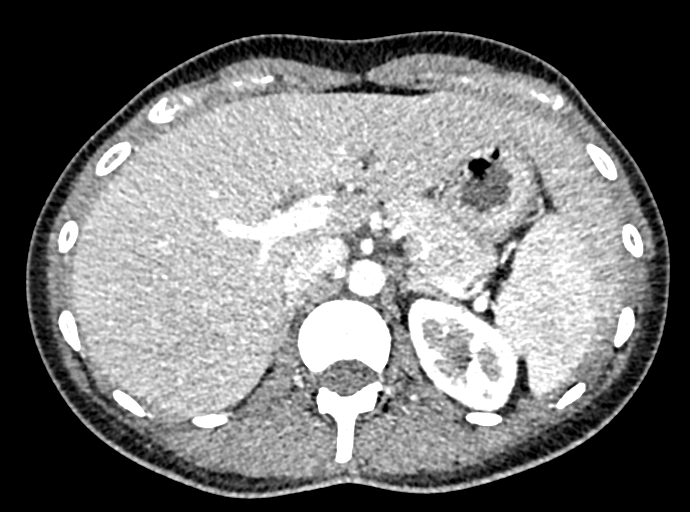
[im 13/160  lung]
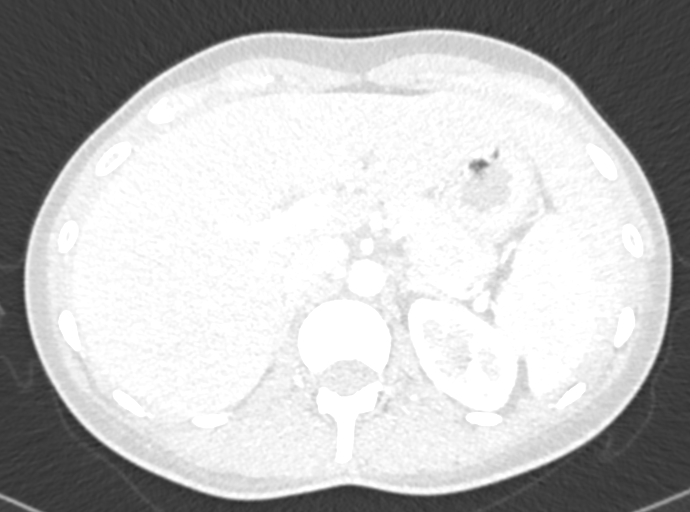
[im 37/160  lung]
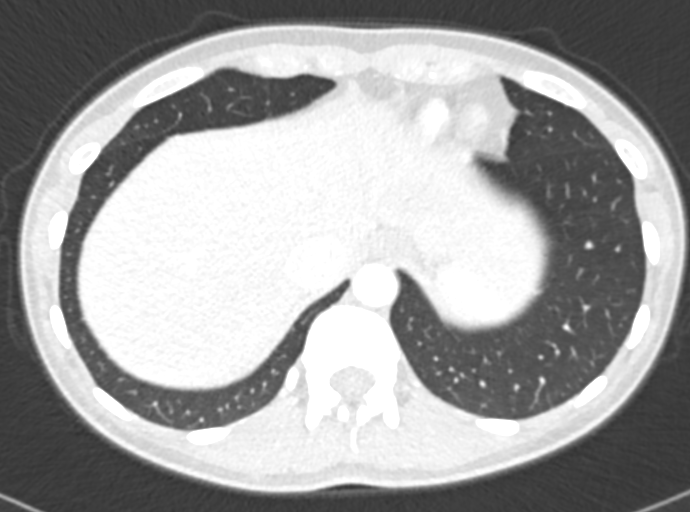
[im 49/160  lung]
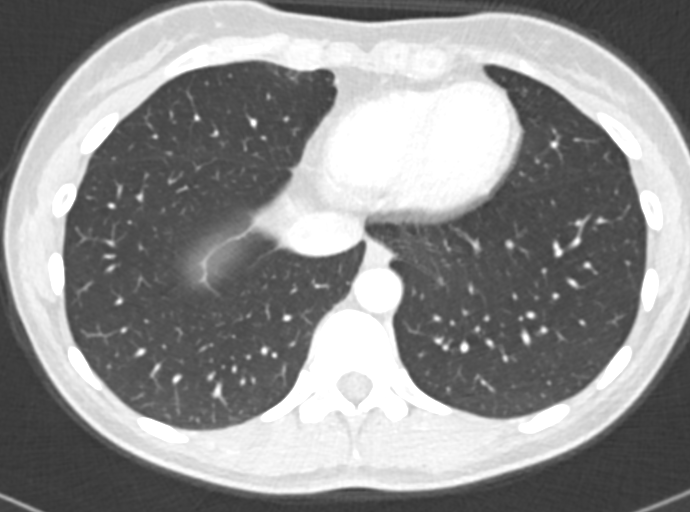
[im 62/160  lung]
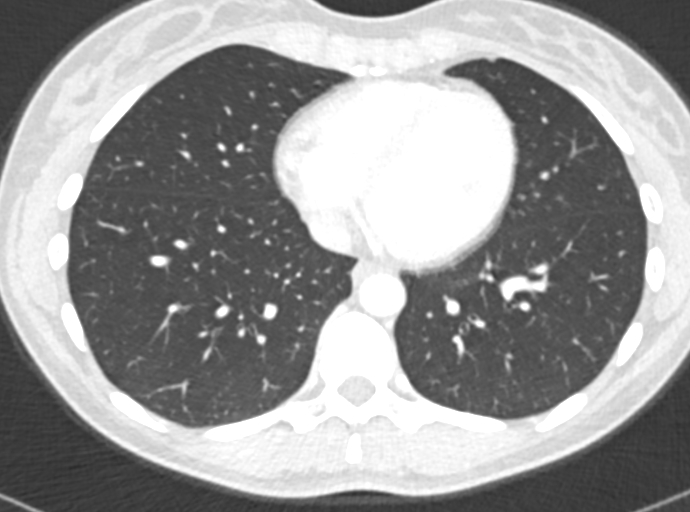
[im 86/160  mediastinal]
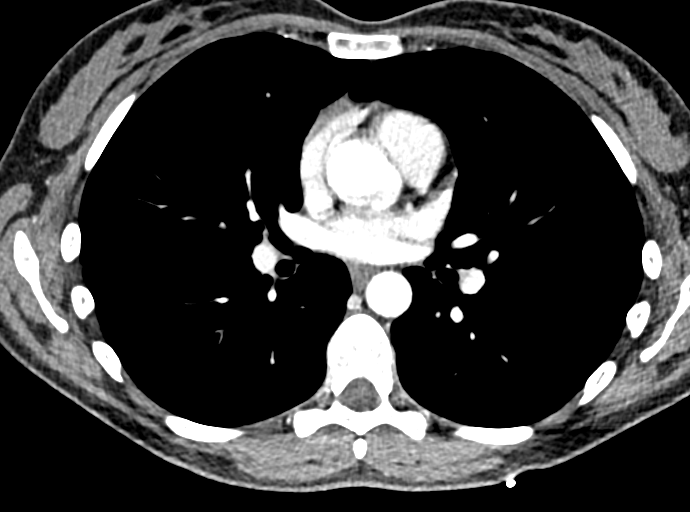
[im 86/160  lung]
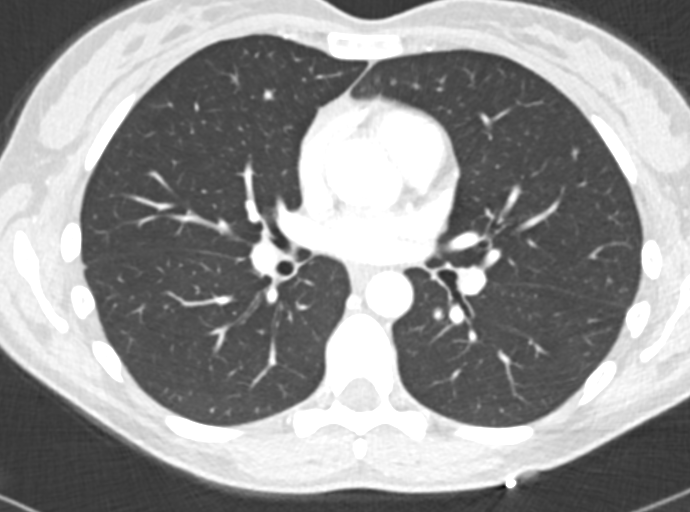
[im 98/160  lung]
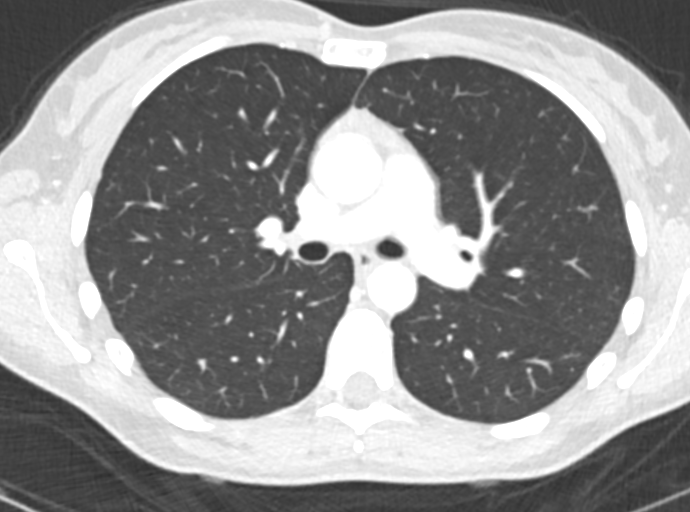
[im 111/160  lung]
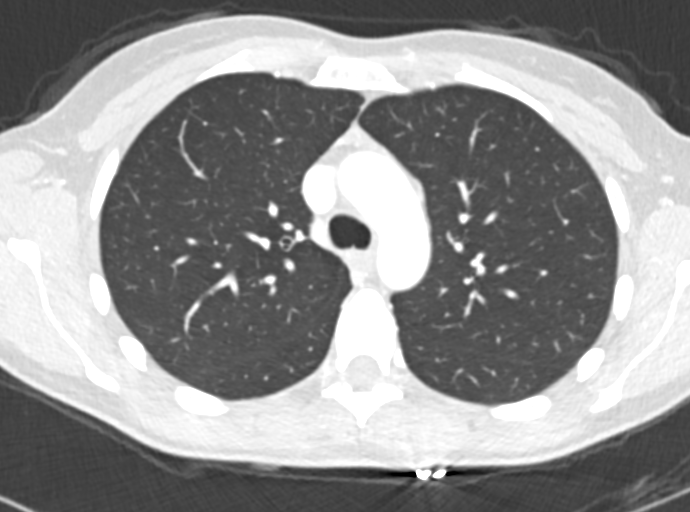
[im 135/160  lung]
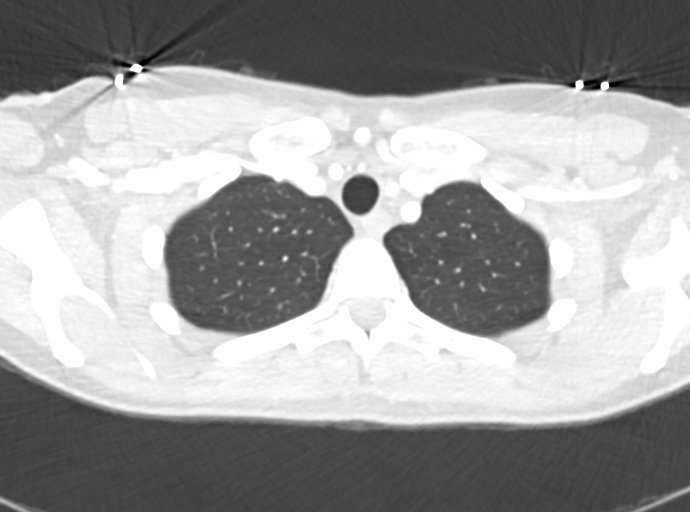
[im 147/160  mediastinal]
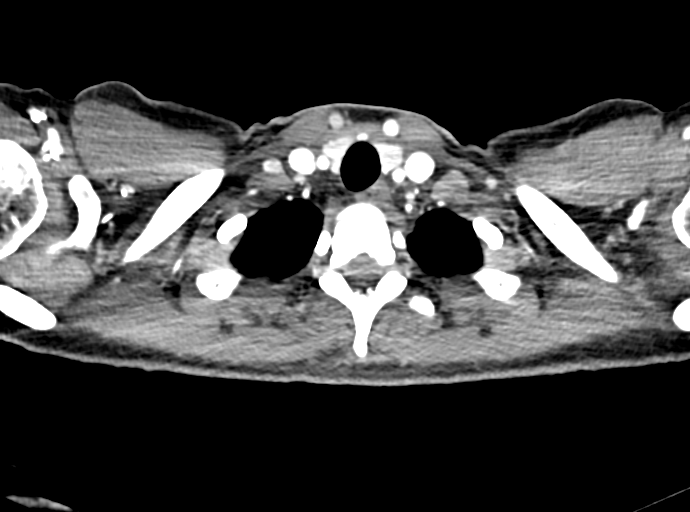
[im 147/160  lung]
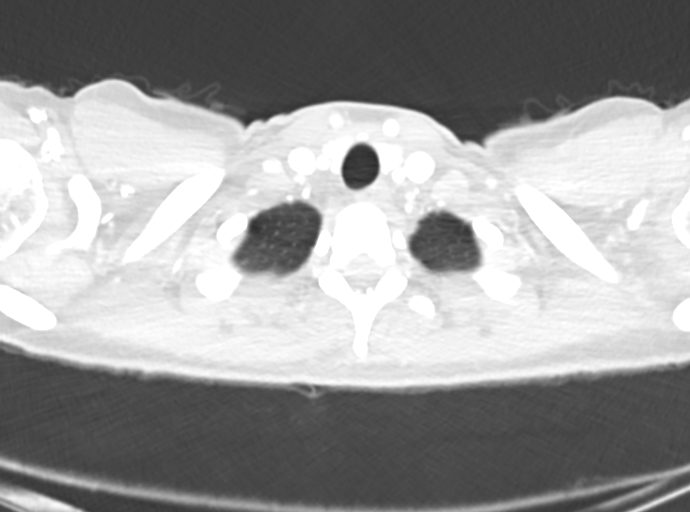

[Series 4: chest 2.00 br40 s3 cor · coronal · 0.58mm/px · 3 of 109 slices shown]
[im 22/109  lung]
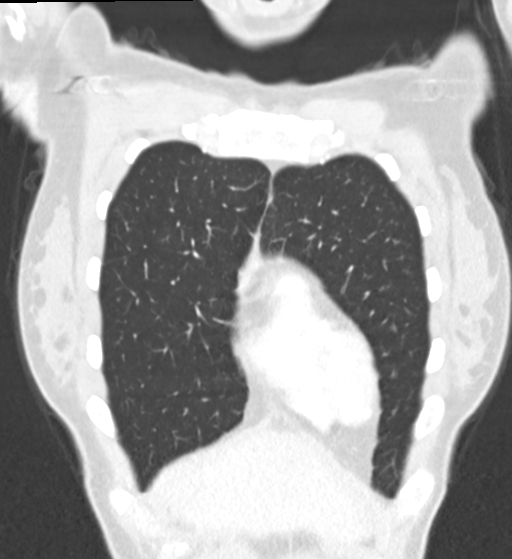
[im 44/109  lung]
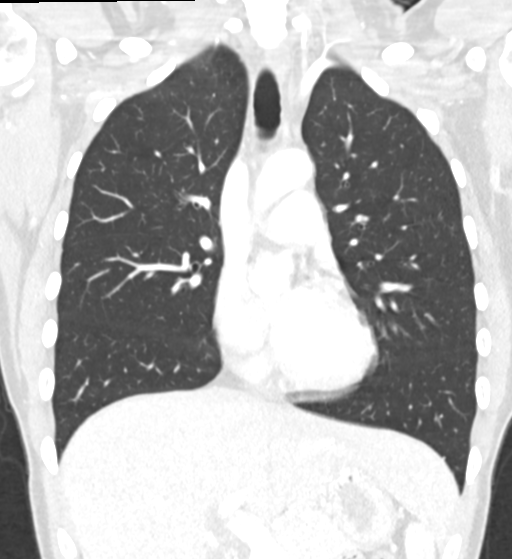
[im 65/109  lung]
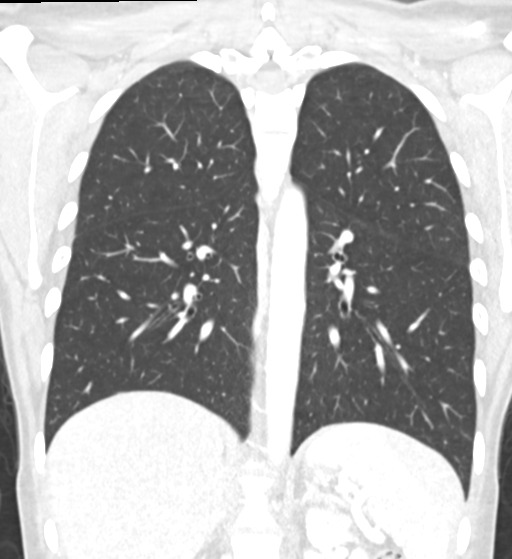

[12 of 36 positions shown; findings below may reference images not displayed]

FINDINGS: Cardiovascular: Heart is normal size. Aorta is normal caliber. No
visible coronary artery calcifications. No filling defects in the
pulmonary arteries to suggest pulmonary emboli.

Mediastinum/Nodes: No mediastinal, hilar, or axillary adenopathy.
Soft tissue in the anterior mediastinum felt represent residual
thymus.

Lungs/Pleura: Lungs are clear. No focal airspace opacities or
suspicious nodules. No effusions.

Upper Abdomen: Imaging into the upper abdomen shows no acute
findings.

Musculoskeletal: Chest wall soft tissues are unremarkable. No acute
bony abnormality.
IMPRESSION: Unremarkable chest CT.
